# Patient Record
Sex: Female | Born: 1985 | Race: Black or African American | Hispanic: No | Marital: Married | Smoking: Former smoker
Health system: Southern US, Community
[De-identification: ages and names within clinical notes are randomized; demographics above are authoritative.]

## PROBLEM LIST (undated history)

## (undated) DIAGNOSIS — K219 Gastro-esophageal reflux disease without esophagitis: Secondary | ICD-10-CM

## (undated) HISTORY — PX: APPENDECTOMY: SHX54

---

## 2011-01-18 HISTORY — PX: TUBAL LIGATION: SHX77

## 2015-08-16 ENCOUNTER — Emergency Department (HOSPITAL_COMMUNITY)
Admission: EM | Admit: 2015-08-16 | Discharge: 2015-08-17 | Disposition: A | Discharge status: Home / Self Care | Payer: Medicaid Other | Attending: Emergency Medicine | Admitting: Emergency Medicine

## 2015-08-16 ENCOUNTER — Encounter (HOSPITAL_COMMUNITY): Payer: Self-pay

## 2015-08-16 DIAGNOSIS — E860 Dehydration: Secondary | ICD-10-CM | POA: Diagnosis not present

## 2015-08-16 DIAGNOSIS — R55 Syncope and collapse: Secondary | ICD-10-CM | POA: Diagnosis present

## 2015-08-16 LAB — URINALYSIS, ROUTINE W REFLEX MICROSCOPIC
Bilirubin Urine: NEGATIVE
Glucose, UA: NEGATIVE mg/dL
Nitrite: NEGATIVE
Specific Gravity, Urine: 1.02 (ref 1.005–1.030)
pH: 6.5 (ref 5.0–8.0)

## 2015-08-16 LAB — BASIC METABOLIC PANEL
Anion gap: 2 — ABNORMAL LOW (ref 5–15)
BUN: 11 mg/dL (ref 6–20)
CO2: 24 mmol/L (ref 22–32)
Calcium: 8.5 mg/dL — ABNORMAL LOW (ref 8.9–10.3)
Chloride: 109 mmol/L (ref 101–111)
Creatinine, Ser: 0.92 mg/dL (ref 0.44–1.00)
GFR calc Af Amer: >60 mL/min (ref >60)
GFR calc non Af Amer: >60 mL/min (ref >60)
Glucose, Bld: 92 mg/dL (ref 65–99)
Potassium: 3.7 mmol/L (ref 3.5–5.1)
Sodium: 135 mmol/L (ref 135–145)

## 2015-08-16 LAB — CBC
HCT: 35 % — ABNORMAL LOW (ref 36.0–46.0)
Hemoglobin: 11.5 g/dL — ABNORMAL LOW (ref 12.0–15.0)
MCH: 30.3 pg (ref 26.0–34.0)
MCHC: 32.9 g/dL (ref 30.0–36.0)
MCV: 92.3 fL (ref 78.0–100.0)
Platelets: 283 10*3/uL (ref 150–400)
RBC: 3.79 MIL/uL — ABNORMAL LOW (ref 3.87–5.11)
RDW: 14.1 % (ref 11.5–15.5)
WBC: 9 10*3/uL (ref 4.0–10.5)

## 2015-08-16 LAB — DIFFERENTIAL
BASOS ABS: 0 10*3/uL (ref 0.0–0.1)
Basophils Relative: 0 %
EOS PCT: 2 %
Eosinophils Absolute: 0.1 10*3/uL (ref 0.0–0.7)
LYMPHS ABS: 2.3 10*3/uL (ref 0.7–4.0)
LYMPHS PCT: 26 %
Monocytes Absolute: 0.9 10*3/uL (ref 0.1–1.0)
Monocytes Relative: 10 %
NEUTROS PCT: 62 %
Neutro Abs: 5.5 10*3/uL (ref 1.7–7.7)

## 2015-08-16 LAB — CBG MONITORING, ED: Glucose-Capillary: 94 mg/dL (ref 65–99)

## 2015-08-16 LAB — URINE MICROSCOPIC-ADD ON

## 2015-08-16 LAB — POC URINE PREG, ED: PREG TEST UR: NEGATIVE

## 2015-08-16 MED ORDER — SODIUM CHLORIDE 0.9 % IV BOLUS (SEPSIS)
1000.0000 mL | Freq: Once | INTRAVENOUS | Status: AC
  Administered 2015-08-16: 1000 mL via INTRAVENOUS

## 2015-08-16 MED ORDER — ONDANSETRON HCL 4 MG/2ML IJ SOLN
4.0000 mg | Freq: Once | INTRAMUSCULAR | Status: AC
  Administered 2015-08-16: 4 mg via INTRAVENOUS
  Filled 2015-08-16: qty 2

## 2015-08-16 NOTE — ED Notes (Signed)
EKG given to Dr Mcmanus, 

## 2015-08-16 NOTE — ED Triage Notes (Signed)
Patient states she has been driving a lot today, and this evening was fixing dinner and "blacked out" patient states she fell against a chair. States episode was less than one minute. Patient states she felt weak prior to syncopal episode

## 2015-08-17 NOTE — Discharge Instructions (Signed)
Try to drink plenty of water.  Follow-up with your doctor this week for recheck.  Return here for any worsening symptoms

## 2015-08-18 LAB — URINE CULTURE

## 2015-08-19 NOTE — ED Provider Notes (Signed)
[facility]Y DEPT Provider Note   CSN: [id number] Arrival date & time: 08/16/15  3016  First Provider Contact:  First MD Initiated Contact with Patient 08/16/15 2135        History   Chief Complaint Chief Complaint  Patient presents with  . Loss of Consciousness    HPI Kimberly Kline is a 30 y.o. female.  HPI  Kimberly Kline is a 30 y.o. female who presents to the Emergency Department complaining of a single, breif episode of syncope shortly prior to ER arrival.  She states that she drove home from [place] and did not drink as much fluids as usual and only ate "snacks" earlier.  She states that when she arrived home, she "passed out" while cooking dinner.  States that she was "out" for less than one minute.  She denies hx of seizure or previous syncopal episodes.  She denies recent illness, shortness of breath, chest pain, vomiting, headache or visual changes.     History reviewed. No pertinent past medical history.  There are no active problems to display for this patient.   Past Surgical History:  Procedure Laterality Date  . TUBAL LIGATION      OB History    No data available       Home Medications    Prior to Admission medications   Not on File    Family History History reviewed. No pertinent family history.  Social History Social History  Substance Use Topics  . Smoking status: Never Smoker  . Smokeless tobacco: Never Used  . Alcohol use No     Allergies   Review of patient's allergies indicates no known allergies.   Review of Systems Review of Systems  Constitutional: Negative for activity change, appetite change and fever.  HENT: Negative for facial swelling and trouble swallowing.   Eyes: Positive for photophobia. Negative for pain and visual disturbance.  Respiratory: Negative for shortness of breath.   Cardiovascular: Negative for chest pain.  Gastrointestinal: Negative for nausea and vomiting.  Musculoskeletal: Negative for  neck pain and neck stiffness.  Skin: Negative for rash and wound.  Neurological: Positive for syncope. Negative for dizziness, facial asymmetry, speech difficulty, weakness (generalized), numbness and headaches.  Psychiatric/Behavioral: Negative for confusion and decreased concentration.  All other systems reviewed and are negative.    Physical Exam Updated Vital Signs BP 114/83   Pulse (!) 59   Temp 98.6 F (37 C) (Oral)   Resp 19   Ht 5\' 3"  (1.6 m)   Wt 79.4 kg   LMP 07/31/2015   SpO2 100%   BMI 31.00 kg/m   Physical Exam  Constitutional: She is oriented to person, place, and time. She appears well-developed and well-nourished. No distress.  HENT:  Head: Normocephalic and atraumatic.  Mouth/Throat: Oropharynx is clear and moist.  Eyes: EOM are normal. Pupils are equal, round, and reactive to light.  Neck: Normal range of motion and phonation normal. Neck supple. No spinous process tenderness and no muscular tenderness present. No neck rigidity. No Kernig's sign noted.  Cardiovascular: Normal rate, regular rhythm and intact distal pulses.   No murmur heard. Pulmonary/Chest: Effort normal and breath sounds normal. No respiratory distress.  Abdominal: Soft. She exhibits no distension and no mass. There is no tenderness. There is no guarding.  Musculoskeletal: Normal range of motion.  Neurological: She is alert and oriented to person, place, and time. She has normal strength. No cranial nerve deficit or sensory deficit. She exhibits normal muscle  tone. Coordination and gait normal. GCS eye subscore is 4. GCS verbal subscore is 5. GCS motor subscore is 6.  Reflex Scores:      Tricep reflexes are 2+ on the right side and 2+ on the left side.      Bicep reflexes are 2+ on the right side and 2+ on the left side. Skin: Skin is warm and dry.  Psychiatric: She has a normal mood and affect.  Nursing note and vitals reviewed.    ED Treatments / Results  Labs (all labs ordered are  listed, but only abnormal results are displayed) Labs Reviewed  URINE CULTURE - Abnormal; Notable for the following:       Result Value   Culture MULTIPLE SPECIES PRESENT, SUGGEST RECOLLECTION (*)    All other components within normal limits  BASIC METABOLIC PANEL - Abnormal; Notable for the following:    Calcium 8.5 (*)    Anion gap 2 (*)    All other components within normal limits  CBC - Abnormal; Notable for the following:    RBC 3.79 (*)    Hemoglobin 11.5 (*)    HCT 35.0 (*)    All other components within normal limits  URINALYSIS, ROUTINE W REFLEX MICROSCOPIC (NOT AT [facility]) - Abnormal; Notable for the following:    Hgb urine dipstick TRACE (*)    Ketones, ur TRACE (*)    Protein, ur TRACE (*)    Leukocytes, UA TRACE (*)    All other components within normal limits  URINE MICROSCOPIC-ADD ON - Abnormal; Notable for the following:    Squamous Epithelial / LPF 0-5 (*)    Bacteria, UA FEW (*)    All other components within normal limits  DIFFERENTIAL  CBG MONITORING, ED  POC URINE PREG, ED    EKG  EKG Interpretation  Date/Time:  Sunday August 16 2015 20:01:21 EDT Ventricular Rate:  89 PR Interval:  174 QRS Duration: 74 QT Interval:  356 QTC Calculation: 433 R Axis:   72 Text Interpretation:  Normal sinus rhythm Possible Left atrial enlargement Borderline ECG No old tracing to compare Confirmed by BELFI  MD, MELANIE ([id number]) on 08/17/2015 8:59:27 AM       Radiology No results found.  Procedures Procedures (including critical care time)  Medications Ordered in ED Medications  sodium chloride 0.9 % bolus 1,000 mL (0 mLs Intravenous Stopped 08/16/15 2309)  ondansetron (ZOFRAN) injection 4 mg (4 mg Intravenous Given 08/16/15 2156)     Initial Impression / Assessment and Plan / ED Course  I have reviewed the triage vital signs and the nursing notes.  Pertinent labs & imaging results that were available during my care of the patient were reviewed by me and considered  in my medical decision making (see chart for details).  Clinical Course    Pt is well appearing.  vitals stable.  Sx's resolving prior to ED arrival.  No focal neuro deficits.  Not post-ictal.   brief syncope likely vaso vagal syncope.  She is feeling better after IVF's.  Orthostatics reassuring as well as labs.  She appears stable for d/c and given ER return precautions.    Final Clinical Impressions(s) / ED Diagnoses   Final diagnoses:  Syncope, unspecified syncope type  Dehydration    New Prescriptions There are no discharge medications for this patient.    Pauline Aus, PA-C 08/19/15 1832    Raeford Razor, MD 08/20/15 2121

## 2015-09-23 ENCOUNTER — Encounter: Payer: Self-pay | Admitting: Family Medicine

## 2015-09-23 ENCOUNTER — Ambulatory Visit (INDEPENDENT_AMBULATORY_CARE_PROVIDER_SITE_OTHER): Payer: Medicaid Other | Admitting: Family Medicine

## 2015-09-23 VITALS — BP 126/74 | HR 68 | Resp 18 | Ht 63.25 in | Wt 168.0 lb

## 2015-09-23 DIAGNOSIS — Z7689 Persons encountering health services in other specified circumstances: Secondary | ICD-10-CM

## 2015-09-23 DIAGNOSIS — Z23 Encounter for immunization: Secondary | ICD-10-CM | POA: Diagnosis not present

## 2015-09-23 DIAGNOSIS — Z7189 Other specified counseling: Secondary | ICD-10-CM | POA: Diagnosis not present

## 2015-09-23 NOTE — Patient Instructions (Signed)
Come back for a PAP and physical  Get copies of old records   [facility].  We welcome your participation and your questions. It is important that you understand your medical conditions and needs.  Working together to provide the best medical care is our goal.

## 2015-09-23 NOTE — Progress Notes (Signed)
Chief Complaint  Patient presents with  . 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 She had no gestational diabetes or hypertension.   There are no active problems to display for this patient.   No outpatient encounter prescriptions on file as of 09/23/2015.   No facility-administered encounter medications on file as of 09/23/2015.     History reviewed. No pertinent past medical history.  Past Surgical History:  Procedure Laterality Date  . TUBAL LIGATION  2013    Social History   Social History  . Marital status: Married    Spouse name: N/A  . Number of children: 3  . Years of education: 20   Occupational History  . assembly [employer]   Social History Main Topics  . Smoking status: Current Some Day Smoker    Types: Cigars  . Smokeless tobacco: Never Used     Comment: 2x week   . Alcohol use Yes     Comment: 2x week   . Drug use: No  . Sexual activity: Not Currently    Birth control/ protection: Surgical   Other Topics Concern  . Not on file   Social History Narrative  . No narrative on file    Family History  Problem Relation Age of Onset  . Hypertension Mother   . Diabetes Mother   . ADD / ADHD Son   . Diabetes Maternal Grandmother   . Hypertension Maternal Grandmother   . Stroke Maternal Grandmother   . Diabetes Maternal Grandfather   . Hypertension Maternal Grandfather   . Asthma Son   . Asthma Son     Review of Systems  Constitutional: Negative for chills, fever and weight loss.  HENT: Negative for congestion and hearing loss.   Eyes: Negative for blurred vision and pain.  Respiratory: Negative  for cough and shortness of breath.   Cardiovascular: Negative for chest pain and leg swelling.  Gastrointestinal: Negative for abdominal pain, constipation, diarrhea and heartburn.  Genitourinary: Negative for dysuria and frequency.  Musculoskeletal: Negative for falls, joint pain and myalgias.  Skin:       Insect bites  Neurological: Negative for dizziness, seizures and headaches.  Psychiatric/Behavioral: Negative for depression. The patient is not nervous/anxious and does not have insomnia.     BP 126/74   Pulse 68   Resp 18   Ht 5' 3.25" (1.607 m)   Wt 168 lb (76.2 kg)   LMP 09/19/2015 (Within Days)   SpO2 99%   BMI 29.53 kg/m   Physical Exam  Constitutional: She is oriented to person, place, and time. She appears well-developed and well-nourished.  HENT:  Head: Normocephalic and atraumatic.  Right Ear: External ear normal.  Left Ear: External ear normal.  Mouth/Throat: Oropharynx is clear and moist.  Eyes: Conjunctivae are normal. Pupils are equal, round, and reactive to light.  Neck: Normal range of motion. Neck supple. No thyromegaly present.  Cardiovascular: Normal rate, regular rhythm and normal heart sounds.   Pulmonary/Chest: Effort normal and breath sounds normal. No respiratory distress.  Abdominal: Soft. Bowel sounds are normal.  Musculoskeletal: Normal range of motion. She exhibits no edema.  Lymphadenopathy:    She has no cervical adenopathy.  Neurological: She is alert and oriented to person, place, and time.  Gait normal  Skin: Skin is warm and dry.  Multiple pink papules c/w insect bites face, arms  Psychiatric: She has a normal mood and affect. Her behavior is normal. Thought content normal.  Nursing note and vitals reviewed.   Encounter to establish care with new doctor  Encounter for immunization  - Flu Vaccine QUAD 36+ mos IM   Patient Instructions  Come back for a PAP and physical  Get copies of old records   It was my pleasure to see you  today at [facility].  We welcome your participation and your questions. It is important that you understand your medical conditions and needs.  Working together to provide the best medical care is our goal.     Eustace Moore, MD

## 2015-10-28 ENCOUNTER — Encounter: Payer: Self-pay | Admitting: Family Medicine

## 2015-10-28 ENCOUNTER — Encounter: Payer: Medicaid Other | Admitting: Family Medicine

## 2015-12-12 ENCOUNTER — Encounter (HOSPITAL_COMMUNITY): Payer: Self-pay | Admitting: *Deleted

## 2015-12-12 ENCOUNTER — Emergency Department (HOSPITAL_COMMUNITY)
Admission: EM | Admit: 2015-12-12 | Discharge: 2015-12-12 | Disposition: A | Discharge status: Home / Self Care | Payer: Medicaid Other | Attending: Emergency Medicine | Admitting: Emergency Medicine

## 2015-12-12 DIAGNOSIS — F17290 Nicotine dependence, other tobacco product, uncomplicated: Secondary | ICD-10-CM | POA: Diagnosis not present

## 2015-12-12 DIAGNOSIS — R112 Nausea with vomiting, unspecified: Secondary | ICD-10-CM | POA: Diagnosis not present

## 2015-12-12 DIAGNOSIS — R1013 Epigastric pain: Secondary | ICD-10-CM | POA: Insufficient documentation

## 2015-12-12 LAB — URINALYSIS, ROUTINE W REFLEX MICROSCOPIC
Bilirubin Urine: NEGATIVE
Glucose, UA: NEGATIVE mg/dL
Ketones, ur: NEGATIVE mg/dL
LEUKOCYTES UA: NEGATIVE
Nitrite: NEGATIVE
PROTEIN: NEGATIVE mg/dL
Specific Gravity, Urine: 1.02 (ref 1.005–1.030)
pH: 6 (ref 5.0–8.0)

## 2015-12-12 LAB — COMPREHENSIVE METABOLIC PANEL
ALT: 15 U/L (ref 14–54)
AST: 16 U/L (ref 15–41)
Albumin: 3.2 g/dL — ABNORMAL LOW (ref 3.5–5.0)
Alkaline Phosphatase: 37 U/L — ABNORMAL LOW (ref 38–126)
Anion gap: 4 — ABNORMAL LOW (ref 5–15)
BUN: 14 mg/dL (ref 6–20)
CHLORIDE: 110 mmol/L (ref 101–111)
CO2: 23 mmol/L (ref 22–32)
Calcium: 8.3 mg/dL — ABNORMAL LOW (ref 8.9–10.3)
Creatinine, Ser: 0.88 mg/dL (ref 0.44–1.00)
Glucose, Bld: 93 mg/dL (ref 65–99)
POTASSIUM: 4 mmol/L (ref 3.5–5.1)
SODIUM: 137 mmol/L (ref 135–145)
Total Bilirubin: 0.1 mg/dL — ABNORMAL LOW (ref 0.3–1.2)
Total Protein: 7.1 g/dL (ref 6.5–8.1)

## 2015-12-12 LAB — PREGNANCY, URINE: PREG TEST UR: NEGATIVE

## 2015-12-12 LAB — CBC WITH DIFFERENTIAL/PLATELET
BASOS ABS: 0 10*3/uL (ref 0.0–0.1)
BASOS PCT: 0 %
EOS ABS: 0.2 10*3/uL (ref 0.0–0.7)
EOS PCT: 2 %
HCT: 34.3 % — ABNORMAL LOW (ref 36.0–46.0)
Hemoglobin: 11.1 g/dL — ABNORMAL LOW (ref 12.0–15.0)
LYMPHS PCT: 35 %
Lymphs Abs: 2.4 10*3/uL (ref 0.7–4.0)
MCH: 30.4 pg (ref 26.0–34.0)
MCHC: 32.4 g/dL (ref 30.0–36.0)
MCV: 94 fL (ref 78.0–100.0)
MONO ABS: 0.5 10*3/uL (ref 0.1–1.0)
Monocytes Relative: 8 %
Neutro Abs: 3.7 10*3/uL (ref 1.7–7.7)
Neutrophils Relative %: 55 %
PLATELETS: 287 10*3/uL (ref 150–400)
RBC: 3.65 MIL/uL — ABNORMAL LOW (ref 3.87–5.11)
RDW: 15 % (ref 11.5–15.5)
WBC: 6.8 10*3/uL (ref 4.0–10.5)

## 2015-12-12 LAB — URINE MICROSCOPIC-ADD ON
Bacteria, UA: NONE SEEN
WBC UA: NONE SEEN WBC/hpf (ref 0–5)

## 2015-12-12 LAB — LIPASE, BLOOD: LIPASE: 29 U/L (ref 11–51)

## 2015-12-12 MED ORDER — FAMOTIDINE IN NACL 20-0.9 MG/50ML-% IV SOLN
20.0000 mg | Freq: Once | INTRAVENOUS | Status: AC
  Administered 2015-12-12: 20 mg via INTRAVENOUS
  Filled 2015-12-12: qty 50

## 2015-12-12 MED ORDER — OMEPRAZOLE 20 MG PO CPDR: 20.0000 mg | DELAYED_RELEASE_CAPSULE | Freq: Every day | ORAL | 0 refills | Status: DC

## 2015-12-12 MED ORDER — GI COCKTAIL ~~LOC~~
30.0000 mL | Freq: Once | ORAL | Status: AC
  Administered 2015-12-12: 30 mL via ORAL
  Filled 2015-12-12: qty 30

## 2015-12-12 MED ORDER — ONDANSETRON HCL 4 MG/2ML IJ SOLN
4.0000 mg | Freq: Once | INTRAMUSCULAR | Status: AC
  Administered 2015-12-12: 4 mg via INTRAVENOUS
  Filled 2015-12-12: qty 2

## 2015-12-12 MED ORDER — SODIUM CHLORIDE 0.9 % IV BOLUS (SEPSIS)
1000.0000 mL | Freq: Once | INTRAVENOUS | Status: AC
  Administered 2015-12-12: 1000 mL via INTRAVENOUS

## 2015-12-12 MED ORDER — SUCRALFATE 1 G PO TABS: 1.0000 g | ORAL_TABLET | Freq: Three times a day (TID) | ORAL | 0 refills | Status: DC

## 2015-12-12 NOTE — ED Provider Notes (Signed)
[facility]-EMERGENCY DEPT Provider Note   CSN: [id number] Arrival date & time: 12/12/15  0423     History   Chief Complaint Chief Complaint  Patient presents with  . Abdominal Pain    HPI Kimberly Kline is a 30 y.o. female.  Patient complains of constant epigastric pain for the past 5 days. It is worse with eating. Patient chooses been vomiting once or twice a day and eating only crackers and ginger ale. Denies diarrhea. Denies fever. States history of acid reflux but does not take medication for it. States this is worse on rest reflux pain. She still has a gallbladder. Denies any urinary or vaginal symptoms. Denies any chest pain or shortness of breath. Denies any excessive alcohol, NSAIDs, spicy food use. She came in today because she could not sleep because of the pain. She's been using Maalox without relief.   The history is provided by the patient.  Abdominal Pain   Associated symptoms include nausea and vomiting. Pertinent negatives include fever, diarrhea, dysuria, hematuria, headaches, arthralgias and myalgias.    History reviewed. No pertinent past medical history.  There are no active problems to display for this patient.   Past Surgical History:  Procedure Laterality Date  . TUBAL LIGATION  2013    OB History    No data available       Home Medications    Prior to Admission medications   Not on File    Family History Family History  Problem Relation Age of Onset  . Hypertension Mother   . Diabetes Mother   . ADD / ADHD Son   . Diabetes Maternal Grandmother   . Hypertension Maternal Grandmother   . Stroke Maternal Grandmother   . Diabetes Maternal Grandfather   . Hypertension Maternal Grandfather   . Asthma Son   . Asthma Son     Social History Social History  Substance Use Topics  . Smoking status: Current Some Day Smoker    Types: Cigars  . Smokeless tobacco: Never Used     Comment: 2x week   . Alcohol use Yes     Comment: 2x week       Allergies   Patient has no known allergies.   Review of Systems Review of Systems  Constitutional: Positive for activity change and appetite change. Negative for fatigue and fever.  HENT: Negative for congestion and rhinorrhea.   Respiratory: Negative for cough, chest tightness and shortness of breath.   Cardiovascular: Negative for chest pain.  Gastrointestinal: Positive for abdominal pain, nausea and vomiting. Negative for diarrhea.  Genitourinary: Negative for dysuria, hematuria, vaginal bleeding and vaginal discharge.  Musculoskeletal: Negative for arthralgias and myalgias.  Skin: Negative for wound.  Neurological: Negative for dizziness, weakness and headaches.  A complete 10 system review of systems was obtained and all systems are negative except as noted in the HPI and PMH.     Physical Exam Updated Vital Signs BP 122/83   Pulse 66   Temp 98.1 F (36.7 C) (Oral)   Resp 16   Ht 5\' 3"  (1.6 m)   Wt 175 lb (79.4 kg)   LMP 11/27/2015   SpO2 100%   BMI 31.00 kg/m   Physical Exam  Constitutional: She is oriented to person, place, and time. She appears well-developed and well-nourished. No distress.  obese  HENT:  Head: Normocephalic and atraumatic.  Mouth/Throat: Oropharynx is clear and moist. No oropharyngeal exudate.  Eyes: Conjunctivae and EOM are normal. Pupils are equal, round,  and reactive to light.  Neck: Normal range of motion. Neck supple.  No meningismus.  Cardiovascular: Normal rate, regular rhythm, normal heart sounds and intact distal pulses.   No murmur heard. Pulmonary/Chest: Effort normal and breath sounds normal. No respiratory distress.  Abdominal: Soft. There is tenderness. There is no rebound and no guarding.  Epigastric tenderness. No RUQ tenderness  Musculoskeletal: Normal range of motion. She exhibits no edema or tenderness.  No CVAT  Neurological: She is alert and oriented to person, place, and time. No cranial nerve deficit. She  exhibits normal muscle tone. Coordination normal.  No ataxia on finger to nose bilaterally. No pronator drift. 5/5 strength throughout. CN 2-12 intact.Equal grip strength. Sensation intact.   Skin: Skin is warm.  Psychiatric: She has a normal mood and affect. Her behavior is normal.  Nursing note and vitals reviewed.    ED Treatments / Results  Labs (all labs ordered are listed, but only abnormal results are displayed) Labs Reviewed  CBC WITH DIFFERENTIAL/PLATELET  COMPREHENSIVE METABOLIC PANEL  LIPASE, BLOOD  URINALYSIS, ROUTINE W REFLEX MICROSCOPIC (NOT AT [facility])  PREGNANCY, URINE    EKG  EKG Interpretation None       Radiology No results found.  Procedures Procedures (including critical care time)  Medications Ordered in ED Medications  famotidine (PEPCID) IVPB 20 mg premix (not administered)  gi cocktail (Maalox,Lidocaine,Donnatal) (not administered)  ondansetron (ZOFRAN) injection 4 mg (not administered)  sodium chloride 0.9 % bolus 1,000 mL (not administered)     Initial Impression / Assessment and Plan / ED Course  I have reviewed the triage vital signs and the nursing notes.  Pertinent labs & imaging results that were available during my care of the patient were reviewed by me and considered in my medical decision making (see chart for details).  Clinical Course   Epigastric abdominal pain with ongoing nausea and vomiting. History of acid reflux but does not take medication. No right upper quadrant pain.  Labs reassuring. LFTs and lipase normal. Urinalysis negative.  Patient given GI cocktail and Pepcid. Pain resolved after GI cocktail.  Treat for gastritis/acid reflux. Doubt gallbladder pathology. Patient informed to stop using NSAIDs, caffeine, alcohol, fatty foods and follow-up with PCP and gastroenterology. Return precautions discussed..  Final Clinical Impressions(s) / ED Diagnoses   Final diagnoses:  Epigastric pain    New Prescriptions New  Prescriptions   No medications on file     Glynn OctaveStephen Muhamad Serano, MD 12/12/15 [phone]

## 2015-12-12 NOTE — ED Notes (Signed)
Pt updated on plan of care,  

## 2015-12-12 NOTE — ED Notes (Signed)
Update given, pt drinking ginger ale, denies any pain,

## 2015-12-12 NOTE — Discharge Instructions (Signed)
Take medications as prescribed. Avoid alcohol, caffeine, spicy foods, anti-inflammatory medications such as Advil and naproxen. Follow up with the stomach doctor. Return to the ED if you develop new or worsening symptoms.

## 2015-12-12 NOTE — ED Triage Notes (Signed)
Pt c/o epigastric pain with n/v for the past 5 days,

## 2016-03-07 ENCOUNTER — Emergency Department (HOSPITAL_COMMUNITY)
Admission: EM | Admit: 2016-03-07 | Discharge: 2016-03-07 | Disposition: A | Discharge status: Home / Self Care | Payer: Medicaid Other | Attending: Emergency Medicine | Admitting: Emergency Medicine

## 2016-03-07 ENCOUNTER — Encounter (HOSPITAL_COMMUNITY): Payer: Self-pay | Admitting: Emergency Medicine

## 2016-03-07 DIAGNOSIS — M791 Myalgia: Secondary | ICD-10-CM | POA: Diagnosis present

## 2016-03-07 DIAGNOSIS — F17290 Nicotine dependence, other tobacco product, uncomplicated: Secondary | ICD-10-CM | POA: Insufficient documentation

## 2016-03-07 DIAGNOSIS — J111 Influenza due to unidentified influenza virus with other respiratory manifestations: Secondary | ICD-10-CM | POA: Diagnosis not present

## 2016-03-07 MED ORDER — IBUPROFEN 600 MG PO TABS: 600.0000 mg | ORAL_TABLET | Freq: Four times a day (QID) | ORAL | 0 refills | Status: DC | PRN

## 2016-03-07 MED ORDER — HYDROCODONE-HOMATROPINE 5-1.5 MG/5ML PO SYRP: 5.0000 mL | ORAL_SOLUTION | Freq: Four times a day (QID) | ORAL | 0 refills | Status: DC | PRN

## 2016-03-07 MED ORDER — ONDANSETRON HCL 4 MG PO TABS: 4.0000 mg | ORAL_TABLET | Freq: Four times a day (QID) | ORAL | 0 refills | Status: DC

## 2016-03-07 MED ORDER — IBUPROFEN 800 MG PO TABS
800.0000 mg | ORAL_TABLET | Freq: Once | ORAL | Status: AC
  Administered 2016-03-07: 800 mg via ORAL
  Filled 2016-03-07: qty 1

## 2016-03-07 MED ORDER — OSELTAMIVIR PHOSPHATE 75 MG PO CAPS: 75.0000 mg | ORAL_CAPSULE | Freq: Two times a day (BID) | ORAL | 0 refills | Status: DC

## 2016-03-07 MED ORDER — OSELTAMIVIR PHOSPHATE 75 MG PO CAPS
75.0000 mg | ORAL_CAPSULE | Freq: Once | ORAL | Status: AC
  Administered 2016-03-07: 75 mg via ORAL
  Filled 2016-03-07: qty 1

## 2016-03-07 MED ORDER — ACETAMINOPHEN 500 MG PO TABS
1000.0000 mg | ORAL_TABLET | Freq: Once | ORAL | Status: AC
  Administered 2016-03-07: 1000 mg via ORAL
  Filled 2016-03-07: qty 2

## 2016-03-07 NOTE — ED Triage Notes (Signed)
Patient c/o flu like symptoms that started today after waking up. Per patient body aches, cough, fever, headache, nausea, and sore throat. Denies taking anything for fever. Denies any vomiting or diarrhea.

## 2016-03-07 NOTE — ED Provider Notes (Signed)
[facility]-EMERGENCY DEPT Provider Note   CSN: [id number] Arrival date & time: 03/07/16  1550  By signing my name below, I, Cynda Acres, attest that this documentation has been prepared under the direction and in the presence of [employer].  Electronically Signed: Cynda Acres, Scribe. 03/07/16. 6:21 PM.   History   Chief Complaint Chief Complaint  Patient presents with  . Generalized Body Aches   HPI Comments: Kimberly Kline is a 31 y.o. female with no apparent medical hx, who presents to the Emergency Department complaining of gradual-onset, consant generalized body aches that began earlier today around 2 PM. Patient reports associated fever (103 max), cough, headache, nausea, and sore throat. Patient has had flu exposure recently. Patient did not have a flu shot this year. No modifying factors indicated. Patient denies any emesis or diarrhea.   HPI  History reviewed. No pertinent past medical history.  There are no active problems to display for this patient.   Past Surgical History:  Procedure Laterality Date  . TUBAL LIGATION  2013    OB History    Gravida Para Term Preterm AB Living   3 3 3     3    SAB TAB Ectopic Multiple Live Births                   Home Medications    Prior to Admission medications   Medication Sig Start Date End Date Taking? Authorizing Provider  omeprazole (PRILOSEC) 20 MG capsule Take 1 capsule (20 mg total) by mouth daily. 12/12/15   Glynn Octave, MD  sucralfate (CARAFATE) 1 g tablet Take 1 tablet (1 g total) by mouth 4 (four) times daily -  with meals and at bedtime. 12/12/15   Glynn Octave, MD    Family History Family History  Problem Relation Age of Onset  . Hypertension Mother   . Diabetes Mother   . ADD / ADHD Son   . Diabetes Maternal Grandmother   . Hypertension Maternal Grandmother   . Stroke Maternal Grandmother   . Diabetes Maternal Grandfather   . Hypertension Maternal Grandfather   . Asthma Son   .  Asthma Son     Social History Social History  Substance Use Topics  . Smoking status: Current Some Day Smoker    Types: Cigars  . Smokeless tobacco: Never Used     Comment: 2x week   . Alcohol use Yes     Comment: 2x week      Allergies   Patient has no known allergies.   Review of Systems Review of Systems  Constitutional: Positive for fever.  HENT: Positive for sore throat.   Respiratory: Positive for cough.   Gastrointestinal: Positive for nausea. Negative for diarrhea and vomiting.  Musculoskeletal: Positive for myalgias.  Neurological: Positive for headaches.  All other systems reviewed and are negative.    Physical Exam Updated Vital Signs BP 143/70 (BP Location: Left Arm)   Pulse 98   Temp (!) 103.1 F (39.5 C) (Oral)   Resp 18   Ht 5\' 3"  (1.6 m)   Wt 170 lb (77.1 kg)   LMP 03/07/2016   SpO2 100%   BMI 30.11 kg/m   Physical Exam  Constitutional: She is oriented to person, place, and time. She appears well-developed.  HENT:  Head: Normocephalic and atraumatic.  Mouth/Throat: Oropharynx is clear and moist.  Mild to moderate nasal congestion present. Oropharynx clear. Uvula midline.   Eyes: Conjunctivae and EOM are normal. Pupils are  equal, round, and reactive to light.  Neck: Normal range of motion. Neck supple.  Full range of motion, no cervical lymph adenopathy.   Cardiovascular: Normal rate.  Exam reveals no gallop and no friction rub.   Tachycardia present.   Pulmonary/Chest: Effort normal and breath sounds normal. She has no wheezes.  Symmetrical rise and fall of the chest. Lungs are clear.   Abdominal: Soft. Bowel sounds are normal. She exhibits no distension. There is no tenderness ( ).  No splenomegaly, no organomegaly.   Musculoskeletal: Normal range of motion.  Neurological: She is alert and oriented to person, place, and time.  Skin: Skin is warm and dry.  No rash of the palms.   Psychiatric: She has a normal mood and affect.     ED  Treatments / Results  DIAGNOSTIC STUDIES: Oxygen Saturation is 100% on RA, normal by my interpretation.    COORDINATION OF CARE: 6:20 PM Discussed treatment plan with pt at bedside and pt agreed to plan, which includes tamiflu, ibuprofen, tylenol, increased fluids, and good handwashing.   Labs (all labs ordered are listed, but only abnormal results are displayed) Labs Reviewed - No data to display  EKG  EKG Interpretation None       Radiology No results found.  Procedures Procedures (including critical care time)  Medications Ordered in ED Medications - No data to display   Initial Impression / Assessment and Plan / ED Course  I have reviewed the triage vital signs and the nursing notes.  Pertinent labs & imaging results that were available during my care of the patient were reviewed by me and considered in my medical decision making (see chart for details).     **I have reviewed nursing notes, vital signs, and all appropriate lab and imaging results for this patient.*  Final Clinical Impressions(s) / ED Diagnoses   MDM: Patient with influenza type symptoms that started today. Patient has been exposed to someone who has been diagnosed with influenza. She will be treated with hycodan, Zofran, tamiflu, and ibuprofen. Discussed good hand washing and increased fluids. Patient is to return to the PCP or return to the ED if her symptoms worsen.   Final diagnoses:  Influenza    New Prescriptions New Prescriptions   No medications on file   **I personally performed the services described in this documentation, which was scribed in my presence. The recorded information has been reviewed and is accurate.Ivery Quale*    Favian Kittleson, PA-C 03/07/16 1834    Lavera Guiseana Duo Liu, MD 03/08/16 [phone]

## 2016-03-07 NOTE — Discharge Instructions (Signed)
Your examination suggest influenza. Please use your mask until symptoms have resolved. Please wash hands frequently. Increase water, juices, Gatorade, Kool-Aid, etc. Use Tamiflu and ibuprofen daily. Use Hycodan for cough if needed. Use Zofran for nausea if needed. Please see Dr. Delton SeeNelson, or return to the emergency department if any changes, problems, or concerns.

## 2016-03-19 ENCOUNTER — Encounter (HOSPITAL_COMMUNITY): Payer: Self-pay | Admitting: *Deleted

## 2016-03-19 ENCOUNTER — Emergency Department (HOSPITAL_COMMUNITY)
Admission: EM | Admit: 2016-03-19 | Discharge: 2016-03-19 | Disposition: A | Discharge status: Home / Self Care | Payer: Medicaid Other | Attending: Emergency Medicine | Admitting: Emergency Medicine

## 2016-03-19 DIAGNOSIS — F17290 Nicotine dependence, other tobacco product, uncomplicated: Secondary | ICD-10-CM | POA: Insufficient documentation

## 2016-03-19 DIAGNOSIS — K2100 Gastro-esophageal reflux disease with esophagitis, without bleeding: Secondary | ICD-10-CM

## 2016-03-19 DIAGNOSIS — R1013 Epigastric pain: Secondary | ICD-10-CM | POA: Diagnosis present

## 2016-03-19 LAB — COMPREHENSIVE METABOLIC PANEL
ALK PHOS: 33 U/L — AB (ref 38–126)
ALT: 15 U/L (ref 14–54)
ANION GAP: 4 — AB (ref 5–15)
AST: 18 U/L (ref 15–41)
Albumin: 3.4 g/dL — ABNORMAL LOW (ref 3.5–5.0)
BILIRUBIN TOTAL: 0.4 mg/dL (ref 0.3–1.2)
BUN: 9 mg/dL (ref 6–20)
CALCIUM: 8.7 mg/dL — AB (ref 8.9–10.3)
CO2: 30 mmol/L (ref 22–32)
CREATININE: 0.84 mg/dL (ref 0.44–1.00)
Chloride: 104 mmol/L (ref 101–111)
GFR calc Af Amer: >60 mL/min (ref >60)
GFR calc non Af Amer: >60 mL/min (ref >60)
GLUCOSE: 93 mg/dL (ref 65–99)
Potassium: 4.2 mmol/L (ref 3.5–5.1)
SODIUM: 138 mmol/L (ref 135–145)
TOTAL PROTEIN: 7.5 g/dL (ref 6.5–8.1)

## 2016-03-19 LAB — URINALYSIS, ROUTINE W REFLEX MICROSCOPIC
BILIRUBIN URINE: NEGATIVE
Bacteria, UA: NONE SEEN
GLUCOSE, UA: NEGATIVE mg/dL
HGB URINE DIPSTICK: NEGATIVE
Ketones, ur: NEGATIVE mg/dL
Leukocytes, UA: NEGATIVE
Nitrite: NEGATIVE
PH: 7 (ref 5.0–8.0)
Protein, ur: NEGATIVE mg/dL
SPECIFIC GRAVITY, URINE: 1.018 (ref 1.005–1.030)

## 2016-03-19 LAB — CBC
HCT: 36.2 % (ref 36.0–46.0)
Hemoglobin: 11.6 g/dL — ABNORMAL LOW (ref 12.0–15.0)
MCH: 30.2 pg (ref 26.0–34.0)
MCHC: 32 g/dL (ref 30.0–36.0)
MCV: 94.3 fL (ref 78.0–100.0)
PLATELETS: 316 10*3/uL (ref 150–400)
RBC: 3.84 MIL/uL — ABNORMAL LOW (ref 3.87–5.11)
RDW: 15.8 % — AB (ref 11.5–15.5)
WBC: 8.5 10*3/uL (ref 4.0–10.5)

## 2016-03-19 LAB — I-STAT BETA HCG BLOOD, ED (MC, WL, AP ONLY)

## 2016-03-19 LAB — LIPASE, BLOOD: Lipase: 16 U/L (ref 11–51)

## 2016-03-19 MED ORDER — GI COCKTAIL ~~LOC~~
30.0000 mL | Freq: Once | ORAL | Status: AC
  Administered 2016-03-19: 30 mL via ORAL
  Filled 2016-03-19: qty 30

## 2016-03-19 MED ORDER — PANTOPRAZOLE SODIUM 20 MG PO TBEC: 20.0000 mg | DELAYED_RELEASE_TABLET | Freq: Every day | ORAL | 0 refills | Status: DC

## 2016-03-19 MED ORDER — TRAMADOL HCL 50 MG PO TABS: 50.0000 mg | ORAL_TABLET | Freq: Four times a day (QID) | ORAL | 0 refills | Status: DC | PRN

## 2016-03-19 NOTE — ED Provider Notes (Signed)
[facility]-EMERGENCY DEPT Provider Note   CSN: [id number] Arrival date & time: 03/19/16  1032     History   Chief Complaint Chief Complaint  Patient presents with  . Abdominal Pain    HPI Kimberly Kline is a 31 y.o. female.  Patient complains of epigastric pain. She's been taken Motrin for a while for flu. She also has low back pain    The history is provided by the patient. No language interpreter was used.  Abdominal Pain   This is a new problem. The current episode started more than 2 days ago. The problem occurs constantly. The problem has not changed since onset.The pain is associated with an unknown factor. The pain is located in the epigastric region. Pertinent negatives include diarrhea, frequency, hematuria and headaches.    No past medical history on file.  There are no active problems to display for this patient.   Past Surgical History:  Procedure Laterality Date  . TUBAL LIGATION  2013    OB History    Gravida Para Term Preterm AB Living   3 3 3     3    SAB TAB Ectopic Multiple Live Births                   Home Medications    Prior to Admission medications   Medication Sig Start Date End Date Taking? Authorizing Provider  HYDROcodone-homatropine (HYCODAN) 5-1.5 MG/5ML syrup Take 5 mLs by mouth every 6 (six) hours as needed. 03/07/16  Yes Ivery QualeHobson Bryant, PA-C  ibuprofen (ADVIL,MOTRIN) 600 MG tablet Take 1 tablet (600 mg total) by mouth every 6 (six) hours as needed. 03/07/16  Yes Ivery QualeHobson Bryant, PA-C  ondansetron (ZOFRAN) 4 MG tablet Take 1 tablet (4 mg total) by mouth every 6 (six) hours. 03/07/16  Yes Ivery QualeHobson Bryant, PA-C  sucralfate (CARAFATE) 1 g tablet Take 1 tablet (1 g total) by mouth 4 (four) times daily -  with meals and at bedtime. 12/12/15  Yes Glynn OctaveStephen Rancour, MD  omeprazole (PRILOSEC) 20 MG capsule Take 1 capsule (20 mg total) by mouth daily. Patient not taking: Reported on 03/19/2016 12/12/15   Glynn OctaveStephen Rancour, MD  oseltamivir (TAMIFLU) 75 MG  capsule Take 1 capsule (75 mg total) by mouth every 12 (twelve) hours. Patient not taking: Reported on 03/19/2016 03/07/16   Ivery QualeHobson Bryant, PA-C  pantoprazole (PROTONIX) 20 MG tablet Take 1 tablet (20 mg total) by mouth daily. 03/19/16   Bethann BerkshireJoseph Kavontae Pritchard, MD  traMADol (ULTRAM) 50 MG tablet Take 1 tablet (50 mg total) by mouth every 6 (six) hours as needed. 03/19/16   Bethann BerkshireJoseph Dawnn Nam, MD    Family History Family History  Problem Relation Age of Onset  . Hypertension Mother   . Diabetes Mother   . ADD / ADHD Son   . Diabetes Maternal Grandmother   . Hypertension Maternal Grandmother   . Stroke Maternal Grandmother   . Diabetes Maternal Grandfather   . Hypertension Maternal Grandfather   . Asthma Son   . Asthma Son     Social History Social History  Substance Use Topics  . Smoking status: Current Some Day Smoker    Types: Cigars  . Smokeless tobacco: Never Used     Comment: 2x week   . Alcohol use Yes     Comment: 2x week      Allergies   Patient has no known allergies.   Review of Systems Review of Systems  Constitutional: Negative for appetite change and fatigue.  HENT:  Negative for congestion, ear discharge and sinus pressure.   Eyes: Negative for discharge.  Respiratory: Negative for cough.   Cardiovascular: Negative for chest pain.  Gastrointestinal: Positive for abdominal pain. Negative for diarrhea.  Genitourinary: Negative for frequency and hematuria.  Musculoskeletal: Positive for back pain.  Skin: Negative for rash.  Neurological: Negative for seizures and headaches.  Psychiatric/Behavioral: Negative for hallucinations.     Physical Exam Updated Vital Signs BP 118/74 (BP Location: Right Arm)   Pulse 72   Temp 99 F (37.2 C) (Oral)   Resp 18   Ht 5\' 3"  (1.6 m)   Wt 170 lb (77.1 kg)   LMP 03/07/2016   SpO2 100%   BMI 30.11 kg/m   Physical Exam  Constitutional: She is oriented to person, place, and time. She appears well-developed.  HENT:  Head:  Normocephalic.  Eyes: Conjunctivae and EOM are normal. No scleral icterus.  Neck: Neck supple. No thyromegaly present.  Cardiovascular: Normal rate and regular rhythm.  Exam reveals no gallop and no friction rub.   No murmur heard. Pulmonary/Chest: No stridor. She has no wheezes. She has no rales. She exhibits no tenderness.  Abdominal: She exhibits no distension. There is tenderness. There is no rebound.  Moderate epigastric tenderness  Musculoskeletal: Normal range of motion. She exhibits no edema.  Mild lumbar spine tenderness  Lymphadenopathy:    She has no cervical adenopathy.  Neurological: She is oriented to person, place, and time. She exhibits normal muscle tone. Coordination normal.  Skin: No rash noted. No erythema.  Psychiatric: She has a normal mood and affect. Her behavior is normal.     ED Treatments / Results  Labs (all labs ordered are listed, but only abnormal results are displayed) Labs Reviewed  COMPREHENSIVE METABOLIC PANEL - Abnormal; Notable for the following:       Result Value   Calcium 8.7 (*)    Albumin 3.4 (*)    Alkaline Phosphatase 33 (*)    Anion gap 4 (*)    All other components within normal limits  CBC - Abnormal; Notable for the following:    RBC 3.84 (*)    Hemoglobin 11.6 (*)    RDW 15.8 (*)    All other components within normal limits  LIPASE, BLOOD  URINALYSIS, ROUTINE W REFLEX MICROSCOPIC  I-STAT BETA HCG BLOOD, ED ([facility], [facility], [facility] ONLY)    EKG  EKG Interpretation None       Radiology No results found.  Procedures Procedures (including critical care time)  Medications Ordered in ED Medications  gi cocktail (Maalox,Lidocaine,Donnatal) (30 mLs Oral Given 03/19/16 1224)     Initial Impression / Assessment and Plan / ED Course  I have reviewed the triage vital signs and the nursing notes.  Pertinent labs & imaging results that were available during my care of the patient were reviewed by me and considered in my medical decision  making (see chart for details).     Labs unremarkable. Patient improved with GI cocktail. She'll be put on protonic is also given Ultram for pain and she was told not to take anymore Motrin. She will follow-up with her PCP  Final Clinical Impressions(s) / ED Diagnoses   Final diagnoses:  Gastroesophageal reflux disease with esophagitis    New Prescriptions New Prescriptions   PANTOPRAZOLE (PROTONIX) 20 MG TABLET    Take 1 tablet (20 mg total) by mouth daily.   TRAMADOL (ULTRAM) 50 MG TABLET    Take 1 tablet (50 mg total)  by mouth every 6 (six) hours as needed.     Bethann Berkshire, MD 03/19/16 1409

## 2016-03-19 NOTE — Discharge Instructions (Signed)
Follow-up with your doctor next week for recheck. Stop taking the Motrin.

## 2016-03-19 NOTE — ED Triage Notes (Signed)
Pt comes in with abdominal pain starting 4 days ago. She began having back 2 days ago. Denies any n/v/d.   Pt was here on 2/19 and told she had the flu. Pt took tamiflu as prescribed. NAD note.

## 2016-08-08 ENCOUNTER — Telehealth: Payer: Self-pay | Admitting: Family Medicine

## 2016-08-08 NOTE — Telephone Encounter (Signed)
New Message  Pt voiced also on nurse line she wanted a PET scan  Please f/u

## 2016-08-08 NOTE — Telephone Encounter (Signed)
Called pt to see what she wanted PET scan for, she states she only wants a pap, and a UPT.  Scheduled.

## 2016-08-08 NOTE — Telephone Encounter (Signed)
Patient left message on nurse line, requesting appt for Pap Smear

## 2016-08-12 ENCOUNTER — Encounter: Payer: Medicaid Other | Admitting: Family Medicine

## 2016-08-12 ENCOUNTER — Ambulatory Visit: Payer: Medicaid Other | Admitting: Family Medicine

## 2016-08-18 ENCOUNTER — Encounter: Payer: Medicaid Other | Admitting: Family Medicine

## 2016-08-18 ENCOUNTER — Encounter: Payer: Self-pay | Admitting: Family Medicine

## 2016-09-02 ENCOUNTER — Encounter: Payer: Medicaid Other | Admitting: Family Medicine

## 2017-02-14 ENCOUNTER — Emergency Department (HOSPITAL_COMMUNITY)
Admission: EM | Admit: 2017-02-14 | Discharge: 2017-02-15 | Disposition: A | Discharge status: Home / Self Care | Payer: Self-pay | Attending: Emergency Medicine | Admitting: Emergency Medicine

## 2017-02-14 ENCOUNTER — Other Ambulatory Visit: Payer: Self-pay

## 2017-02-14 ENCOUNTER — Encounter (HOSPITAL_COMMUNITY): Payer: Self-pay | Admitting: Emergency Medicine

## 2017-02-14 ENCOUNTER — Emergency Department (HOSPITAL_COMMUNITY): Payer: Self-pay

## 2017-02-14 DIAGNOSIS — K5900 Constipation, unspecified: Secondary | ICD-10-CM | POA: Insufficient documentation

## 2017-02-14 DIAGNOSIS — R1013 Epigastric pain: Secondary | ICD-10-CM | POA: Insufficient documentation

## 2017-02-14 DIAGNOSIS — Z87891 Personal history of nicotine dependence: Secondary | ICD-10-CM | POA: Insufficient documentation

## 2017-02-14 DIAGNOSIS — R251 Tremor, unspecified: Secondary | ICD-10-CM | POA: Insufficient documentation

## 2017-02-14 DIAGNOSIS — R0602 Shortness of breath: Secondary | ICD-10-CM | POA: Insufficient documentation

## 2017-02-14 DIAGNOSIS — R112 Nausea with vomiting, unspecified: Secondary | ICD-10-CM | POA: Insufficient documentation

## 2017-02-14 HISTORY — DX: Gastro-esophageal reflux disease without esophagitis: K21.9

## 2017-02-14 LAB — CBC
HEMATOCRIT: 37.7 % (ref 36.0–46.0)
Hemoglobin: 11.9 g/dL — ABNORMAL LOW (ref 12.0–15.0)
MCH: 30.7 pg (ref 26.0–34.0)
MCHC: 31.6 g/dL (ref 30.0–36.0)
MCV: 97.2 fL (ref 78.0–100.0)
PLATELETS: 265 10*3/uL (ref 150–400)
RBC: 3.88 MIL/uL (ref 3.87–5.11)
RDW: 13.2 % (ref 11.5–15.5)
WBC: 6.4 10*3/uL (ref 4.0–10.5)

## 2017-02-14 LAB — COMPREHENSIVE METABOLIC PANEL
ALT: 18 U/L (ref 14–54)
AST: 21 U/L (ref 15–41)
Albumin: 3.1 g/dL — ABNORMAL LOW (ref 3.5–5.0)
Alkaline Phosphatase: 35 U/L — ABNORMAL LOW (ref 38–126)
Anion gap: 9 (ref 5–15)
BUN: 13 mg/dL (ref 6–20)
CHLORIDE: 102 mmol/L (ref 101–111)
CO2: 28 mmol/L (ref 22–32)
CREATININE: 0.99 mg/dL (ref 0.44–1.00)
Calcium: 8.8 mg/dL — ABNORMAL LOW (ref 8.9–10.3)
GFR calc Af Amer: >60 mL/min (ref >60)
GFR calc non Af Amer: >60 mL/min (ref >60)
Glucose, Bld: 100 mg/dL — ABNORMAL HIGH (ref 65–99)
POTASSIUM: 3.8 mmol/L (ref 3.5–5.1)
SODIUM: 139 mmol/L (ref 135–145)
Total Bilirubin: 0.4 mg/dL (ref 0.3–1.2)
Total Protein: 6.3 g/dL — ABNORMAL LOW (ref 6.5–8.1)

## 2017-02-14 LAB — POC URINE PREG, ED: Preg Test, Ur: NEGATIVE

## 2017-02-14 LAB — LIPASE, BLOOD: LIPASE: 34 U/L (ref 11–51)

## 2017-02-14 MED ORDER — ONDANSETRON HCL 4 MG/2ML IJ SOLN
4.0000 mg | Freq: Once | INTRAMUSCULAR | Status: AC
  Administered 2017-02-14: 4 mg via INTRAVENOUS
  Filled 2017-02-14: qty 2

## 2017-02-14 MED ORDER — FENTANYL CITRATE (PF) 100 MCG/2ML IJ SOLN
50.0000 ug | Freq: Once | INTRAMUSCULAR | Status: AC
  Administered 2017-02-14: 50 ug via INTRAVENOUS
  Filled 2017-02-14: qty 2

## 2017-02-14 NOTE — ED Triage Notes (Signed)
Patient reports abdominal pain that radiates into her L flank, onset 1 month ago. Patient states she has had episodes of emesis but none today. Patient denies urinary symptoms.

## 2017-02-15 LAB — URINALYSIS, ROUTINE W REFLEX MICROSCOPIC
BILIRUBIN URINE: NEGATIVE
Glucose, UA: NEGATIVE mg/dL
Hgb urine dipstick: NEGATIVE
Ketones, ur: NEGATIVE mg/dL
LEUKOCYTES UA: NEGATIVE
NITRITE: NEGATIVE
PH: 7 (ref 5.0–8.0)
Protein, ur: NEGATIVE mg/dL
Specific Gravity, Urine: 1.016 (ref 1.005–1.030)

## 2017-02-15 MED ORDER — OMEPRAZOLE 20 MG PO CPDR: DELAYED_RELEASE_CAPSULE | ORAL | 0 refills | Status: DC

## 2017-02-15 NOTE — Discharge Instructions (Signed)
Get miralax and put one dose or 17 g in 8 ounces of water,  take 1 dose every 30 minutes for 2-3 hours or until you  get good results and then once or twice daily to prevent constipation. Take the omeprazole as prescribed. Look at the diet recommendations for GERD. Call Dr Darrick PennaFields' office, the gastroenterologist on call, to have her evalutae your abdominal pain. You could have a stomach inflammation or ulcer that would not be visible on the CT scan.   Return to the ED if you get a fever, have uncontrolled vomiting or seem worse.

## 2017-02-15 NOTE — ED Provider Notes (Signed)
[facility] EMERGENCY DEPARTMENT Provider Note   CSN: [id number] Arrival date & time: 02/14/17  2210  Time seen 23:28 PM   History   Chief Complaint Chief Complaint  Patient presents with  . Abdominal Pain    HPI Kimberly Kline is a 32 y.o. female.  HPI patient states for the past 3 weeks she has been having sharp pain in her epigastric area and her left upper quadrant.  She states the pain is sharp when it is at its most intense and then at its baseline is and it is a pain described as soreness.  She states the pain waxes and wanes.  She states when it first started out her abdomen felt tight and it hurts so bad it made her feel short of breath and made her shake all over and she had to leave work.  She states she also has reflux or GERD and describes that as a epigastric pain that is dull in a pushing pain.  She has been taking Zantac and cimetidine and Tums without relief of the current pain.  She has had nausea and has only had about 4 episodes of vomiting in the past 3 weeks.  She denies diarrhea or fever.  She states she now is having left-sided flank pain but denies any pain of her lower abdomen.  Nothing she does makes the pain worse, nothing she does makes it feel better.  She denies dysuria, frequency, hematuria, or rectal bleeding.  She states her abdomen does feel bloated.  She states she has had a normal appetite and does not relate this pain to food.  She states about a week after this pain started she took something called the cleaner and that seemed to make it worse so she stopped taking it.  PCP none  Past Medical History:  Diagnosis Date  . GERD (gastroesophageal reflux disease)     There are no active problems to display for this patient.   Past Surgical History:  Procedure Laterality Date  . TUBAL LIGATION  2013    OB History    Gravida Para Term Preterm AB Living   3 3 3     3    SAB TAB Ectopic Multiple Live Births                   Home Medications     Prior to Admission medications   Medication Sig Start Date End Date Taking? Authorizing Provider  RaNITidine HCl (ACID REDUCER PO) Take 1 tablet by mouth daily as needed (for acid reflux).   Yes [provider]  omeprazole (PRILOSEC) 20 MG capsule Take 1 po BID x 2 weeks then once a day 02/15/17   Devoria Albe, MD    Family History Family History  Problem Relation Age of Onset  . Hypertension Mother   . Diabetes Mother   . ADD / ADHD Son   . Diabetes Maternal Grandmother   . Hypertension Maternal Grandmother   . Stroke Maternal Grandmother   . Diabetes Maternal Grandfather   . Hypertension Maternal Grandfather   . Asthma Son   . Asthma Son     Social History Social History   Tobacco Use  . Smoking status: Former Smoker    Types: Cigars  . Smokeless tobacco: Never Used  . Tobacco comment: 2x week   Substance Use Topics  . Alcohol use: No    Frequency: Never    Comment: 2x week   . Drug use: No  employed   Allergies   Patient has no known allergies.   Review of Systems Review of Systems  All other systems reviewed and are negative.    Physical Exam Updated Vital Signs BP 119/86   Pulse 95   Temp 98.6 F (37 C) (Oral)   Resp 18   Ht 5\' 3"  (1.6 m)   Wt 77.1 kg (170 lb)   LMP 02/11/2017   SpO2 98%   BMI 30.11 kg/m   Vital signs normal    Physical Exam  Constitutional: She is oriented to person, place, and time. She appears well-developed and well-nourished.  Non-toxic appearance. She does not appear ill. No distress.  HENT:  Head: Normocephalic and atraumatic.  Right Ear: External ear normal.  Left Ear: External ear normal.  Nose: Nose normal. No mucosal edema or rhinorrhea.  Mouth/Throat: Oropharynx is clear and moist and mucous membranes are normal. No dental abscesses or uvula swelling.  Eyes: Conjunctivae and EOM are normal. Pupils are equal, round, and reactive to light.  Neck: Normal range of motion and full passive range of motion  without pain. Neck supple.  Cardiovascular: Normal rate, regular rhythm and normal heart sounds. Exam reveals no gallop and no friction rub.  No murmur heard. Pulmonary/Chest: Effort normal and breath sounds normal. No respiratory distress. She has no wheezes. She has no rhonchi. She has no rales. She exhibits no tenderness and no crepitus.  Abdominal: Soft. Normal appearance and bowel sounds are normal. She exhibits no distension. There is generalized tenderness and tenderness in the left upper quadrant. There is no rebound and no guarding.    Patient states the worst pain is in the epigastric area  Patient has left CVA tenderness to percussion.  Musculoskeletal: Normal range of motion. She exhibits no edema or tenderness.  Moves all extremities well.   Neurological: She is alert and oriented to person, place, and time. She has normal strength. No cranial nerve deficit.  Skin: Skin is warm, dry and intact. No rash noted. No erythema. No pallor.  Psychiatric: She has a normal mood and affect. Her speech is normal and behavior is normal. Her mood appears not anxious.  Nursing note and vitals reviewed.    ED Treatments / Results  Labs (all labs ordered are listed, but only abnormal results are displayed) Results for orders placed or performed during the hospital encounter of 02/14/17  Lipase, blood  Result Value Ref Range   Lipase 34 11 - 51 U/L  Comprehensive metabolic panel  Result Value Ref Range   Sodium 139 135 - 145 mmol/L   Potassium 3.8 3.5 - 5.1 mmol/L   Chloride 102 101 - 111 mmol/L   CO2 28 22 - 32 mmol/L   Glucose, Bld 100 (H) 65 - 99 mg/dL   BUN 13 6 - 20 mg/dL   Creatinine, Ser 1.61 0.44 - 1.00 mg/dL   Calcium 8.8 (L) 8.9 - 10.3 mg/dL   Total Protein 6.3 (L) 6.5 - 8.1 g/dL   Albumin 3.1 (L) 3.5 - 5.0 g/dL   AST 21 15 - 41 U/L   ALT 18 14 - 54 U/L   Alkaline Phosphatase 35 (L) 38 - 126 U/L   Total Bilirubin 0.4 0.3 - 1.2 mg/dL   GFR calc non Af Amer >60 >60 mL/min    GFR calc Af Amer >60 >60 mL/min   Anion gap 9 5 - 15  CBC  Result Value Ref Range   WBC 6.4 4.0 - 10.5 K/uL  RBC 3.88 3.87 - 5.11 MIL/uL   Hemoglobin 11.9 (L) 12.0 - 15.0 g/dL   HCT 16.1 09.6 - 04.5 %   MCV 97.2 78.0 - 100.0 fL   MCH 30.7 26.0 - 34.0 pg   MCHC 31.6 30.0 - 36.0 g/dL   RDW 40.9 81.1 - 91.4 %   Platelets 265 150 - 400 K/uL  Urinalysis, Routine w reflex microscopic  Result Value Ref Range   Color, Urine YELLOW YELLOW   APPearance CLEAR CLEAR   Specific Gravity, Urine 1.016 1.005 - 1.030   pH 7.0 5.0 - 8.0   Glucose, UA NEGATIVE NEGATIVE mg/dL   Hgb urine dipstick NEGATIVE NEGATIVE   Bilirubin Urine NEGATIVE NEGATIVE   Ketones, ur NEGATIVE NEGATIVE mg/dL   Protein, ur NEGATIVE NEGATIVE mg/dL   Nitrite NEGATIVE NEGATIVE   Leukocytes, UA NEGATIVE NEGATIVE  POC urine preg, ED (not at [facility])  Result Value Ref Range   Preg Test, Ur NEGATIVE NEGATIVE   Laboratory interpretation all normal except minimal anemia    EKG  EKG Interpretation None       Radiology Ct Renal Stone Study  Result Date: 02/15/2017 CLINICAL DATA:  Abdominal pain radiating to the left flank for 1 month. Vomiting. EXAM: CT ABDOMEN AND PELVIS WITHOUT CONTRAST TECHNIQUE: Multidetector CT imaging of the abdomen and pelvis was performed following the standard protocol without IV contrast. COMPARISON:  None. FINDINGS: Lower chest: Lung bases are clear. Hepatobiliary: No focal liver abnormality is seen. No gallstones, gallbladder wall thickening, or biliary dilatation. Pancreas: Unremarkable. No pancreatic ductal dilatation or surrounding inflammatory changes. Spleen: Normal in size without focal abnormality. Adrenals/Urinary Tract: Adrenal glands are unremarkable. Kidneys are normal, without renal calculi, focal lesion, or hydronephrosis. Bladder is unremarkable. Stomach/Bowel: Stomach is within normal limits. Appendix appears normal. No evidence of bowel wall thickening, distention, or inflammatory  changes. Vascular/Lymphatic: No significant vascular findings are present. No enlarged abdominal or pelvic lymph nodes. Reproductive: Uterus and bilateral adnexa are unremarkable. Other: No abdominal wall hernia or abnormality. No abdominopelvic ascites. Musculoskeletal: No acute or significant osseous findings. IMPRESSION: 1. No renal or ureteral stone or obstruction. No acute process demonstrated in the abdomen or pelvis on noncontrast imaging. Electronically Signed   By: Burman Nieves M.D.   On: 02/15/2017 00:15    Procedures Procedures (including critical care time)  Medications Ordered in ED Medications  fentaNYL (SUBLIMAZE) injection 50 mcg (50 mcg Intravenous Given 02/14/17 2354)  ondansetron (ZOFRAN) injection 4 mg (4 mg Intravenous Given 02/14/17 2354)     Initial Impression / Assessment and Plan / ED Course  I have reviewed the triage vital signs and the nursing notes.  Pertinent labs & imaging results that were available during my care of the patient were reviewed by me and considered in my medical decision making (see chart for details).     When I look at patient CT scan she is noted to have a lot of stool throughout her colon.  I am going to advise her to take a laxative and refer her to a gastric  Recheck at 1:10 AM we discussed her CT results.  We also discussed that look like she had a lot of stool throughout her colon.  She is advised to take over-the-counter MiraLAX and to continue on her GERD medication.  If she continues to have pain she should be evaluated by gastroenterologist, she was given the name of the gastroenterologist on call.  We discussed that the CT scan could miss a stomach  ulcer or gastritis.  That is why she should follow-up with a gastroenterologist.  Looking at the website for the cleaner shows it has soy fiber, [place], apple pectin, psyllium husk, fibersol-2, senna leaf, aloe vera gel, [place], yellow dock root, dandelion root, closed, black walnut  mark, milk thistle, triphala extract, calcium citrate, FOS probiotic growth, magnesium citrate, blessed thistle, red Clover, peppermint leaf, worm wood, corn silk, chastetree berry, [place], caprylic acid.   Final Clinical Impressions(s) / ED Diagnoses   Final diagnoses:  Epigastric abdominal pain  Constipation, unspecified constipation type    ED Discharge Orders        Ordered    omeprazole (PRILOSEC) 20 MG capsule     02/15/17 0124    otc miralax  Plan discharge  Devoria AlbeIva Johntae Broxterman, MD, Concha PyoFACEP     Hayla Hinger, MD 02/15/17 [phone]

## 2017-06-21 ENCOUNTER — Encounter (HOSPITAL_COMMUNITY): Payer: Self-pay | Admitting: *Deleted

## 2017-06-21 ENCOUNTER — Emergency Department (HOSPITAL_COMMUNITY)
Admission: EM | Admit: 2017-06-21 | Discharge: 2017-06-21 | Disposition: A | Discharge status: Home / Self Care | Payer: Medicaid Other | Attending: Emergency Medicine | Admitting: Emergency Medicine

## 2017-06-21 ENCOUNTER — Other Ambulatory Visit: Payer: Self-pay

## 2017-06-21 DIAGNOSIS — J029 Acute pharyngitis, unspecified: Secondary | ICD-10-CM

## 2017-06-21 LAB — GROUP A STREP BY PCR: GROUP A STREP BY PCR: NOT DETECTED

## 2017-06-21 NOTE — Discharge Instructions (Addendum)
Your symptoms are most likely from a virus that has caused an upper respiratory infection. Your rapid strep test was negative. A viral illness typically peaks on day 2-3 and resolves after one week.    The main treatment approach for a viral upper respiratory infection is to treat the symptoms, support your immune system and prevent spread of illness.   Stay well-hydrated. Rest. You can use over the counter medications to help with symptoms: 600 mg ibuprofen (motrin, aleve, advil) or acetaminophen (tylenol) every 6 hours, around the clock to help with associated fevers, sore throat, headaches, generalized body aches and malaise.  Oxymetazoline (afrin) intranasal spray once daily for no more than 3 days to help with congestion, after 3 days you can switch to another over-the-counter nasal steroid spray such as fluticasone (flonase) Allergy medication (loratadine, cetirizine, etc) and phenylephrine (sudafed) help with nasal congestion, runny nose and postnasal drip.   Dextromethorphan (dayquil, tussin, mucinex) to help with chest congestion, cough and mucus.  Wash your hands often to prevent spread.   A viral upper respiratory infection can also worsen and progress into pneumonia.  Monitor your symptoms. If your symptoms worsen, persist and you develop persistent fevers, chest pain, productive cough you should follow up with your primary care provider.

## 2017-06-21 NOTE — ED Triage Notes (Signed)
Pt c/o sore throat and bilateral ear pain that started yesterday

## 2017-06-21 NOTE — ED Provider Notes (Signed)
[facility] EMERGENCY DEPARTMENT Provider Note   CSN: [id number] Arrival date & time: 06/21/17  1243     History   Chief Complaint Chief Complaint  Patient presents with  . Sore Throat    HPI Kimberly Kline is a 32 y.o. female here for evaluation of "burning" discomfort to throat onset last night.  Associated symptoms include burning in bilateral ears, nasal congestion, rhinorrhea.  She denies fevers, chills, cough, nausea, vomiting, abdominal pain, dysuria, flank pain, changes in BM. No sick contacts. Has tried cough drop without relief. No alleviating factors. No aggravating factors.   HPI  Past Medical History:  Diagnosis Date  . GERD (gastroesophageal reflux disease)     There are no active problems to display for this patient.   Past Surgical History:  Procedure Laterality Date  . TUBAL LIGATION  2013     OB History    Gravida  3   Para  3   Term  3   Preterm      AB      Living  3     SAB      TAB      Ectopic      Multiple      Live Births               Home Medications    Prior to Admission medications   Not on File    Family History Family History  Problem Relation Age of Onset  . Hypertension Mother   . Diabetes Mother   . ADD / ADHD Son   . Diabetes Maternal Grandmother   . Hypertension Maternal Grandmother   . Stroke Maternal Grandmother   . Diabetes Maternal Grandfather   . Hypertension Maternal Grandfather   . Asthma Son   . Asthma Son     Social History Social History   Tobacco Use  . Smoking status: Former Smoker    Types: Cigars  . Smokeless tobacco: Never Used  . Tobacco comment: 2x week   Substance Use Topics  . Alcohol use: No    Frequency: Never    Comment: 2x week   . Drug use: No     Allergies   Patient has no known allergies.   Review of Systems Review of Systems  HENT: Positive for congestion, ear pain, rhinorrhea and sore throat.   All other systems reviewed and are  negative.    Physical Exam Updated Vital Signs BP 116/86 (BP Location: Right Arm)   Pulse 78   Temp 98.7 F (37.1 C) (Oral)   Resp 16   Ht 5\' 3"  (1.6 m)   Wt 81.6 kg (180 lb)   LMP 06/17/2017   SpO2 100%   BMI 31.89 kg/m   Physical Exam  Constitutional: She is oriented to person, place, and time. She appears well-developed and well-nourished. No distress.  NAD.  HENT:  Head: Normocephalic and atraumatic.  Right Ear: External ear normal.  Left Ear: External ear normal.  Nose: Nose normal.  Mouth/Throat: Mucous membranes are normal. Tonsils are 2+ on the right. Tonsils are 2+ on the left.  Mild mucosal edema L > R with clear rhinorrhea noted, septum midline. Mildly erythematous oropharynx and tonsils.  Symmetrically enlarged tonsils without exudates, petechiae. No trismus. MMM. No SL edema or tenderness. Uvula midline.  TMs normal bilaterally. No mastoid tenderness. No sinus tenderness.   Eyes: Conjunctivae and EOM are normal. No scleral icterus.  Neck: Normal range of motion. Neck supple.  No cervical adenopathy, anterior edema or tenderness.   Cardiovascular: Normal rate, regular rhythm and normal heart sounds.  Pulmonary/Chest: Effort normal and breath sounds normal.  Musculoskeletal: Normal range of motion. She exhibits no deformity.  Neurological: She is alert and oriented to person, place, and time.  Skin: Skin is warm and dry. Capillary refill takes less than 2 seconds.  Psychiatric: She has a normal mood and affect. Her behavior is normal. Judgment and thought content normal.  Nursing note and vitals reviewed.    ED Treatments / Results  Labs (all labs ordered are listed, but only abnormal results are displayed) Labs Reviewed  GROUP A STREP BY PCR    EKG None  Radiology No results found.  Procedures Procedures (including critical care time)  Medications Ordered in ED Medications - No data to display   Initial Impression / Assessment and Plan / ED  Course  I have reviewed the triage vital signs and the nursing notes.  Pertinent labs & imaging results that were available during my care of the patient were reviewed by me and considered in my medical decision making (see chart for details).    32 y.o. -year-old female with URI like symptoms onset last night with sore throat. No known sick contacts. On my exam patient is nontoxic appearing, speaking in full sentences, w/o increased WOB. No fever, tachypnea, tachycardia, hypoxia. Lungs are CTAB. I do not think that a CXR is indicated at this time as VS are WNL, there are no signs of consolidation on auscultation and there is no hypoxia. No significant h/o immunocompromise. Doubt bronchitis or pneumonia.  Rapid strep negative. Given reassuring physical exam, will discharge with symptomatic treatment. Strict ED return precautions given. Patient is aware that a viral URI infection may precede pneumonia or worsening illness. Patient is aware of red flag symptoms to monitor for that would warrant return to the ED for further reevaluation.    Final Clinical Impressions(s) / ED Diagnoses   Final diagnoses:  Viral pharyngitis    ED Discharge Orders    None       Liberty Handy, PA-C 06/21/17 1716    Samuel Jester, DO 06/22/17 [phone]

## 2017-11-06 ENCOUNTER — Other Ambulatory Visit: Payer: Self-pay

## 2017-11-06 ENCOUNTER — Encounter (HOSPITAL_COMMUNITY): Payer: Self-pay | Admitting: *Deleted

## 2017-11-06 DIAGNOSIS — Z87891 Personal history of nicotine dependence: Secondary | ICD-10-CM | POA: Insufficient documentation

## 2017-11-06 DIAGNOSIS — R300 Dysuria: Secondary | ICD-10-CM | POA: Insufficient documentation

## 2017-11-06 DIAGNOSIS — K5909 Other constipation: Secondary | ICD-10-CM | POA: Insufficient documentation

## 2017-11-06 LAB — COMPREHENSIVE METABOLIC PANEL
ALBUMIN: 3.7 g/dL (ref 3.5–5.0)
ALT: 15 U/L (ref 0–44)
ANION GAP: 6 (ref 5–15)
AST: 14 U/L — ABNORMAL LOW (ref 15–41)
Alkaline Phosphatase: 40 U/L (ref 38–126)
BUN: 8 mg/dL (ref 6–20)
CO2: 26 mmol/L (ref 22–32)
Calcium: 9 mg/dL (ref 8.9–10.3)
Chloride: 104 mmol/L (ref 98–111)
Creatinine, Ser: 0.76 mg/dL (ref 0.44–1.00)
GFR calc Af Amer: >60 mL/min (ref >60)
GFR calc non Af Amer: >60 mL/min (ref >60)
GLUCOSE: 82 mg/dL (ref 70–99)
POTASSIUM: 3.7 mmol/L (ref 3.5–5.1)
SODIUM: 136 mmol/L (ref 135–145)
TOTAL PROTEIN: 8.2 g/dL — AB (ref 6.5–8.1)
Total Bilirubin: 0.4 mg/dL (ref 0.3–1.2)

## 2017-11-06 LAB — CBC
HCT: 38.2 % (ref 36.0–46.0)
Hemoglobin: 11.8 g/dL — ABNORMAL LOW (ref 12.0–15.0)
MCH: 30.2 pg (ref 26.0–34.0)
MCHC: 30.9 g/dL (ref 30.0–36.0)
MCV: 97.7 fL (ref 80.0–100.0)
NRBC: 0 % (ref 0.0–0.2)
Platelets: 291 10*3/uL (ref 150–400)
RBC: 3.91 MIL/uL (ref 3.87–5.11)
RDW: 13 % (ref 11.5–15.5)
WBC: 10.5 10*3/uL (ref 4.0–10.5)

## 2017-11-06 LAB — LIPASE, BLOOD: Lipase: 29 U/L (ref 11–51)

## 2017-11-06 NOTE — ED Triage Notes (Signed)
No BM FOR 4 DAYS

## 2017-11-06 NOTE — ED Triage Notes (Signed)
Abdominal pain x 4 days 

## 2017-11-07 ENCOUNTER — Emergency Department (HOSPITAL_COMMUNITY): Payer: Medicaid Other

## 2017-11-07 ENCOUNTER — Emergency Department (HOSPITAL_COMMUNITY)
Admission: EM | Admit: 2017-11-07 | Discharge: 2017-11-07 | Disposition: A | Discharge status: Home / Self Care | Payer: Medicaid Other | Attending: Emergency Medicine | Admitting: Emergency Medicine

## 2017-11-07 DIAGNOSIS — R300 Dysuria: Secondary | ICD-10-CM

## 2017-11-07 DIAGNOSIS — K5909 Other constipation: Secondary | ICD-10-CM

## 2017-11-07 LAB — URINALYSIS, ROUTINE W REFLEX MICROSCOPIC
BILIRUBIN URINE: NEGATIVE
Glucose, UA: NEGATIVE mg/dL
KETONES UR: NEGATIVE mg/dL
NITRITE: NEGATIVE
PH: 6 (ref 5.0–8.0)
Protein, ur: NEGATIVE mg/dL
SPECIFIC GRAVITY, URINE: 1.011 (ref 1.005–1.030)

## 2017-11-07 LAB — PREGNANCY, URINE: Preg Test, Ur: NEGATIVE

## 2017-11-07 MED ORDER — KETOROLAC TROMETHAMINE 30 MG/ML IJ SOLN
30.0000 mg | Freq: Once | INTRAMUSCULAR | Status: AC
  Administered 2017-11-07: 30 mg via INTRAMUSCULAR
  Filled 2017-11-07: qty 1

## 2017-11-07 MED ORDER — CEPHALEXIN 500 MG PO CAPS: 500.0000 mg | ORAL_CAPSULE | Freq: Three times a day (TID) | ORAL | 0 refills | Status: DC

## 2017-11-07 MED ORDER — PEG 3350-KCL-NABCB-NACL-NASULF 236 G PO SOLR: ORAL | 0 refills | Status: DC

## 2017-11-07 NOTE — ED Provider Notes (Signed)
[facility] EMERGENCY DEPARTMENT Provider Note   CSN: [id number] Arrival date & time: 11/06/17  2129     History   Chief Complaint Chief Complaint  Patient presents with  . Abdominal Pain    HPI Kimberly Kline is a 32 y.o. female.  HPI  This is a 32 year old female who presents with 3 to 4-day history of constipation and abdominal bloating.  Patient reports that she has not had a bowel movement in 3 to 4 days.  She has had increasing abdominal bloating and "gas."  She states she is uncomfortable laying on her back or her stomach.  She rates pain at 7 out of 10.  She reports that she has taken a laxative and an enema with minimal relief.  She does not normally have issues with constipation.  She denies any prior abdominal surgeries.  She reports nausea without vomiting.  No fevers.  She does report some burning with urination.  No hematuria or back pain.  Past Medical History:  Diagnosis Date  . GERD (gastroesophageal reflux disease)     There are no active problems to display for this patient.   Past Surgical History:  Procedure Laterality Date  . TUBAL LIGATION  2013     OB History    Gravida  3   Para  3   Term  3   Preterm      AB      Living  3     SAB      TAB      Ectopic      Multiple      Live Births               Home Medications    Prior to Admission medications   Medication Sig Start Date End Date Taking? Authorizing Provider  cephALEXin (KEFLEX) 500 MG capsule Take 1 capsule (500 mg total) by mouth 3 (three) times daily. 11/07/17   Ossiel Marchio, Mayer Masker, MD  polyethylene glycol (GOLYTELY) 236 g solution Drink 8 oz every 15-30 minutes until stools are loose and/or prep is complete 11/07/17   Cathi Hazan, Mayer Masker, MD    Family History Family History  Problem Relation Age of Onset  . Hypertension Mother   . Diabetes Mother   . ADD / ADHD Son   . Diabetes Maternal Grandmother   . Hypertension Maternal Grandmother   . Stroke  Maternal Grandmother   . Diabetes Maternal Grandfather   . Hypertension Maternal Grandfather   . Asthma Son   . Asthma Son     Social History Social History   Tobacco Use  . Smoking status: Former Smoker    Types: Cigars  . Smokeless tobacco: Never Used  . Tobacco comment: 2x week   Substance Use Topics  . Alcohol use: No    Frequency: Never    Comment: 2x week   . Drug use: No     Allergies   Patient has no known allergies.   Review of Systems Review of Systems  Constitutional: Negative for fever.  Respiratory: Negative for shortness of breath.   Cardiovascular: Negative for chest pain.  Gastrointestinal: Positive for abdominal pain, constipation and nausea. Negative for blood in stool, diarrhea and vomiting.  Genitourinary: Positive for dysuria. Negative for flank pain and hematuria.  Musculoskeletal: Negative for back pain.  All other systems reviewed and are negative.    Physical Exam Updated Vital Signs BP 137/88   Pulse 66   Temp 98.4 F (36.9  C)   Resp 17   Ht 1.6 m (5\' 3" )   Wt 83.9 kg   LMP 10/26/2017   SpO2 99%   BMI 32.77 kg/m   Physical Exam  Constitutional: She is oriented to person, place, and time. She appears well-developed and well-nourished. No distress.  HENT:  Head: Normocephalic and atraumatic.  Eyes: Pupils are equal, round, and reactive to light.  Neck: Neck supple.  Cardiovascular: Normal rate, regular rhythm and normal heart sounds.  Pulmonary/Chest: Effort normal. No respiratory distress. She has no wheezes.  Abdominal: Soft. Bowel sounds are normal. There is no tenderness. There is no rebound and no guarding.  Neurological: She is alert and oriented to person, place, and time.  Skin: Skin is warm and dry.  Psychiatric: She has a normal mood and affect.  Nursing note and vitals reviewed.    ED Treatments / Results  Labs (all labs ordered are listed, but only abnormal results are displayed) Labs Reviewed  COMPREHENSIVE  METABOLIC PANEL - Abnormal; Notable for the following components:      Result Value   Total Protein 8.2 (*)    AST 14 (*)    All other components within normal limits  CBC - Abnormal; Notable for the following components:   Hemoglobin 11.8 (*)    All other components within normal limits  URINALYSIS, ROUTINE W REFLEX MICROSCOPIC - Abnormal; Notable for the following components:   Color, Urine AMBER (*)    APPearance HAZY (*)    Hgb urine dipstick SMALL (*)    Leukocytes, UA LARGE (*)    Bacteria, UA RARE (*)    All other components within normal limits  URINE CULTURE  LIPASE, BLOOD  PREGNANCY, URINE    EKG None  Radiology Dg Abdomen 1 View  Result Date: 11/07/2017 CLINICAL DATA:  Constipation and abdominal pain. EXAM: ABDOMEN - 1 VIEW COMPARISON:  CT 02/14/2017 FINDINGS: Normal bowel gas pattern. No bowel dilatation to suggest obstruction. Moderate stool in the ascending and transverse colon, no significant formed stool in the descending or rectosigmoid colon. No evidence of free air. Multiple phleboliths without radiopaque calculi. Lung bases are clear. No osseous abnormalities. IMPRESSION: Normal bowel gas pattern.  Moderate stool in the proximal colon. Electronically Signed   By: Narda Rutherford M.D.   On: 11/07/2017 01:46    Procedures Procedures (including critical care time)  Medications Ordered in ED Medications  ketorolac (TORADOL) 30 MG/ML injection 30 mg (30 mg Intramuscular Given 11/07/17 0135)     Initial Impression / Assessment and Plan / ED Course  I have reviewed the triage vital signs and the nursing notes.  Pertinent labs & imaging results that were available during my care of the patient were reviewed by me and considered in my medical decision making (see chart for details).     Presents with constipation, abdominal bloating, dysuria.  She is overall nontoxic-appearing vital signs are reassuring.  Her exam is fairly benign without any reproducible  pain.  She denies any obstructive symptoms including vomiting.  She does report 1 day of dysuria.  No fevers or back pain to suggest pyelonephritis.  KUB shows moderate stool burden.  Otherwise no evidence of obstruction.  Basic lab work is largely reassuring.  Patient does have large leukocyte esterase and rare bacteria in her urine.  Given symptoms, would elect to culture and treat.  Suspect patient's symptoms are related to constipation.  Discussed with patient more aggressive routes for cleanout.  Patient opted for GoLYTELY.  After history, exam, and medical workup I feel the patient has been appropriately medically screened and is safe for discharge home. Pertinent diagnoses were discussed with the patient. Patient was given return precautions.   Final Clinical Impressions(s) / ED Diagnoses   Final diagnoses:  Other constipation  Dysuria    ED Discharge Orders         Ordered    polyethylene glycol (GOLYTELY) 236 g solution     11/07/17 0222    cephALEXin (KEFLEX) 500 MG capsule  3 times daily     11/07/17 0222           Shon Baton, MD 11/07/17 [phone]

## 2017-11-08 LAB — URINE CULTURE: Culture: NO GROWTH

## 2017-11-28 DIAGNOSIS — K353 Acute appendicitis with localized peritonitis, without perforation or gangrene: Secondary | ICD-10-CM

## 2017-11-28 HISTORY — DX: Acute appendicitis with localized peritonitis, without perforation or gangrene: K35.30

## 2018-08-20 ENCOUNTER — Telehealth: Payer: Self-pay | Admitting: Obstetrics & Gynecology

## 2018-08-20 NOTE — Telephone Encounter (Signed)

## 2018-08-21 ENCOUNTER — Ambulatory Visit (INDEPENDENT_AMBULATORY_CARE_PROVIDER_SITE_OTHER): Payer: Self-pay | Admitting: Obstetrics & Gynecology

## 2018-08-21 ENCOUNTER — Other Ambulatory Visit: Payer: Self-pay

## 2018-08-21 ENCOUNTER — Other Ambulatory Visit (HOSPITAL_COMMUNITY)
Admission: RE | Admit: 2018-08-21 | Discharge: 2018-08-21 | Disposition: A | Discharge status: Home / Self Care | Payer: Medicaid Other | Source: Ambulatory Visit | Attending: Obstetrics & Gynecology | Admitting: Obstetrics & Gynecology

## 2018-08-21 ENCOUNTER — Encounter: Payer: Self-pay | Admitting: Obstetrics & Gynecology

## 2018-08-21 VITALS — BP 111/73 | HR 75 | Ht 63.0 in | Wt 200.0 lb

## 2018-08-21 DIAGNOSIS — N946 Dysmenorrhea, unspecified: Secondary | ICD-10-CM

## 2018-08-21 DIAGNOSIS — Z124 Encounter for screening for malignant neoplasm of cervix: Secondary | ICD-10-CM | POA: Diagnosis not present

## 2018-08-21 DIAGNOSIS — N920 Excessive and frequent menstruation with regular cycle: Secondary | ICD-10-CM

## 2018-08-21 MED ORDER — MEGESTROL ACETATE 40 MG PO TABS: ORAL_TABLET | ORAL | 3 refills | Status: DC

## 2018-08-21 NOTE — Progress Notes (Signed)
Chief Complaint  Patient presents with  . Menorrhagia    have pain rt groin area when walk      33 y.o. [id number] Patient's last menstrual period was 08/03/2018. The current method of family planning is none.  Outpatient Encounter Medications as of 08/21/2018  Medication Sig  . cephALEXin (KEFLEX) 500 MG capsule Take 1 capsule (500 mg total) by mouth 3 (three) times daily.  . megestrol (MEGACE) 40 MG tablet 3 tablets a day for 5 days, 2 tablets a day for 5 days then 1 tablet daily  . polyethylene glycol (GOLYTELY) 236 g solution Drink 8 oz every 15-30 minutes until stools are loose and/or prep is complete (Patient not taking: Reported on 08/21/2018)   No facility-administered encounter medications on file as of 08/21/2018.     Subjective Pt has had increasingly painful heavy menses since birth of her last child 6 years ago Worsening over time Makes her nauseated Has to leave work due to pain No dyspareunia Has clots RLQ pain associated right groin Bleeds regularly every month 6 days Past Medical History:  Diagnosis Date  . GERD (gastroesophageal reflux disease)     Past Surgical History:  Procedure Laterality Date  . TUBAL LIGATION  2013    OB History    Gravida  3   Para  3   Term  3   Preterm      AB      Living  3     SAB      TAB      Ectopic      Multiple      Live Births              No Known Allergies  Social History   Socioeconomic History  . Marital status: Single    Spouse name: Not on file  . Number of children: 3  . Years of education: 42  . Highest education level: Not on file  Occupational History  . Occupation: assembly    Employer: [employer]  . Financial resource strain: Not on file  . Food insecurity    Worry: Not on file    Inability: Not on file  . Transportation needs    Medical: Not on file    Non-medical: Not on file  Tobacco Use  . Smoking status: Former Smoker    Types: Cigars  .  Smokeless tobacco: Never Used  . Tobacco comment: 2x week   Substance and Sexual Activity  . Alcohol use: No    Frequency: Never    Comment: 2x week   . Drug use: No  . Sexual activity: Not Currently    Birth control/protection: Surgical  Lifestyle  . Physical activity    Days per week: Not on file    Minutes per session: Not on file  . Stress: Not on file  Relationships  . Social Herbalist on phone: Not on file    Gets together: Not on file    Attends religious service: Not on file    Active member of club or organization: Not on file    Attends meetings of clubs or organizations: Not on file    Relationship status: Not on file  Other Topics Concern  . Not on file  Social History Narrative  . Not on file    Family History  Problem Relation Age of Onset  . Hypertension Mother   . Diabetes Mother   .  ADD / ADHD Son   . Diabetes Maternal Grandmother   . Hypertension Maternal Grandmother   . Stroke Maternal Grandmother   . Diabetes Maternal Grandfather   . Hypertension Maternal Grandfather   . Asthma Son   . Asthma Son     Medications:       Current Outpatient Medications:  .  cephALEXin (KEFLEX) 500 MG capsule, Take 1 capsule (500 mg total) by mouth 3 (three) times daily., Disp: 21 capsule, Rfl: 0 .  megestrol (MEGACE) 40 MG tablet, 3 tablets a day for 5 days, 2 tablets a day for 5 days then 1 tablet daily, Disp: 45 tablet, Rfl: 3 .  polyethylene glycol (GOLYTELY) 236 g solution, Drink 8 oz every 15-30 minutes until stools are loose and/or prep is complete (Patient not taking: Reported on 08/21/2018), Disp: 4000 mL, Rfl: 0  Objective Blood pressure 111/73, pulse 75, height 5\' 3"  (1.6 m), weight 200 lb (90.7 kg), last menstrual period 08/03/2018.  General WDWN female NAD Vulva:  normal appearing vulva with no masses, tenderness or lesions Vagina:  normal mucosa, no discharge Cervix:  Normal no lesions Pap done Uterus:  normal size, contour, position,  consistency, mobility, non-tender Adnexa: ovaries:present,  normal adnexa in size, nontender and no masses   Pertinent ROS No burning with urination, frequency or urgency No nausea, vomiting or diarrhea Nor fever chills or other constitutional symptoms   Labs or studies     Impression Diagnoses this Encounter::   ICD-10-CM   1. Menorrhagia with regular cycle  N92.0 US PELVIS (TRANSABDOMINAL ONLY)    US PELVIS TRANSVAGINAL NON-OB (TV ONLY)  2. Dysmenorrhea  N94.6 US PELVIS (TRANSABDOMINAL ONLY)    US PELVIS TRANSVAGINAL NON-OB (TV ONLY)    Established relevant diagnosis(es):   Plan/Recommendations: Meds ordered this encounter  Medications  . megestrol (MEGACE) 40 MG tablet    Sig: 3 tablets a day for 5 days, 2 tablets a day for 5 days then 1 tablet daily    Dispense:  45 tablet    Refill:  3    Labs or Scans Ordered: Orders Placed This Encounter  Procedures  . US PELVIS (TRANSABDOMINAL ONLY)  . US PELVIS TRANSVAGINAL NON-OB (TV ONLY)    Management:: >megestrol to suppress her menses >sonogram 1 month for endometrial evaluation, no fibroids appreciated on exam At that time will discuss options for cycle management going forward  Follow up Return in about 1 month (around 09/21/2018) for GYN sono, Follow up, with Dr Despina HiddenEure.       All questions were answered.

## 2018-08-21 NOTE — Addendum Note (Signed)
Addended by: Diona Fanti A on: 08/21/2018 02:43 PM   Modules accepted: Orders

## 2018-08-21 NOTE — Addendum Note (Signed)
Addended by: Diona Fanti A on: 08/21/2018 02:41 PM   Modules accepted: Orders

## 2018-08-27 LAB — CYTOLOGY - PAP
Diagnosis: NEGATIVE
HPV: NOT DETECTED

## 2018-09-21 ENCOUNTER — Ambulatory Visit (INDEPENDENT_AMBULATORY_CARE_PROVIDER_SITE_OTHER): Payer: Self-pay

## 2018-09-21 ENCOUNTER — Telehealth: Payer: Self-pay | Admitting: Obstetrics & Gynecology

## 2018-09-21 ENCOUNTER — Other Ambulatory Visit: Payer: Self-pay

## 2018-09-21 DIAGNOSIS — N946 Dysmenorrhea, unspecified: Secondary | ICD-10-CM

## 2018-09-21 DIAGNOSIS — N920 Excessive and frequent menstruation with regular cycle: Secondary | ICD-10-CM

## 2018-09-21 NOTE — Progress Notes (Signed)
PELVIC US TA/TV: homogeneous anteverted uterus,wnl,EEC 5.7 mm,normal ovaries bilat,ovaries appear mobile,trace of cul de sac fluid,no pain during ultrasound

## 2018-09-21 NOTE — Telephone Encounter (Signed)
Patient had an [place] today and she would like a phone call from Dr. Elonda Husky with the results. Please contact pt

## 2018-09-21 NOTE — Telephone Encounter (Signed)
Sent mychart message informing patient results wouldn't be ready today.

## 2018-11-27 ENCOUNTER — Other Ambulatory Visit: Payer: Self-pay | Admitting: Obstetrics & Gynecology

## 2018-11-27 ENCOUNTER — Telehealth: Payer: Self-pay | Admitting: *Deleted

## 2018-11-27 NOTE — Telephone Encounter (Signed)
Patient left message that she has a concern about the megestrol she has been on for four months. Wants a call back.

## 2018-11-27 NOTE — Telephone Encounter (Signed)
Pt states that she has been spotting after sex wonders if this related to the Megace. Advised that she may want to do STD screen. Pt doesn't currently have medicaid and wanted to know how much a self swab would be. Advised patient that I would check with front desk and call her tomorrow with price. Patient agreeable and we will discuss tomorrow morning.

## 2018-11-29 NOTE — Telephone Encounter (Signed)
Called patient back. She states that she just isn't doing well with the megace. She still bleeds and is gaining weight. She is ready to discuss other options to manage her bleeding. Appt scheduled with Dr. Elonda Husky for next week.

## 2018-12-03 ENCOUNTER — Encounter: Payer: Self-pay | Admitting: Obstetrics & Gynecology

## 2018-12-03 ENCOUNTER — Other Ambulatory Visit: Payer: Self-pay

## 2018-12-03 ENCOUNTER — Ambulatory Visit (INDEPENDENT_AMBULATORY_CARE_PROVIDER_SITE_OTHER): Payer: Self-pay | Admitting: Obstetrics & Gynecology

## 2018-12-03 VITALS — BP 131/91 | HR 65 | Ht 63.0 in | Wt 202.0 lb

## 2018-12-03 DIAGNOSIS — N946 Dysmenorrhea, unspecified: Secondary | ICD-10-CM

## 2018-12-03 DIAGNOSIS — N920 Excessive and frequent menstruation with regular cycle: Secondary | ICD-10-CM

## 2018-12-03 NOTE — Progress Notes (Signed)
Follow up appointment for results  Chief Complaint  Patient presents with  . Follow-up    still bleeding on megace. wants to discuss other options    Blood pressure (!) 131/91, pulse 65, height 5\' 3"  (1.6 m), weight 202 lb (91.6 kg).  GYNECOLOGIC SONOGRAM   Kimberly Kline is a 33 y.o. [id number] LMP 09/13/2018,she is here for a pelvic sonogram for menorrhagia.  Uterus                      8.5 x 5.9 x 7.2 cm, Total uterine volume 187 cc, homogeneous anteverted uterus,wnl  Endometrium          5.7 mm, symmetrical, wnl  Right ovary             1.9 x 3.3 x 1.8 cm, wnl  Left ovary                3.2 x 2.3 x 1.6 cm, wnl    Technician Comments:  PELVIC US TA/TV: homogeneous anteverted uterus,wnl,EEC 5.7 mm,normal ovaries bilat,ovaries appear mobile,trace of cul de sac fluid,no pain during ultrasound     [employer] 09/21/2018 10:19 AM  Clinical Impression and recommendations:  I have reviewed the sonogram results above.  Being performed due to heavy menses, currently on cycle day 11  Combined with the patient's current clinical course, below are my impressions and any appropriate recommendations for management based on the sonographic findings: 1.  Anteverted symmetric uterus with homogenous myometrial tissues and a thin endometrial stripe.  On transverse views there on the right side there is a small fibroid along the lower uterine segment and a similar small fibroid on the right.  There is no abnormality in the blood flow within the myometrium.  Ovaries are small bilaterally 2.  Minimal cul-de-sac fluid considered  normal  FINDINGS:  Kimberly Kline 09/25/2018     MEDS ordered this encounter: No orders of the defined types were placed in this encounter.   Orders for this encounter: No orders of the defined types were placed in this encounter.   Impression: 1. Menorrhagia with regular cycle   2. Dysmenorrhea   Plan: Discussed options IUD vs OCP  vs Depo Vs ablation and pt opts for endometrial ablation  Follow Up: No follow-ups on file.       Face to face time:  15 minutes  Greater than 50% of the visit time was spent in counseling and coordination of care with the patient.  The summary and outline of the counseling and care coordination is summarized in the note above.   All questions were answered.  Past Medical History:  Diagnosis Date  . GERD (gastroesophageal reflux disease)     Past Surgical History:  Procedure Laterality Date  . TUBAL LIGATION  2013    OB History    Gravida  3   Para  3   Term  3   Preterm      AB      Living  3     SAB      TAB      Ectopic      Multiple      Live Births              No Known Allergies  Social History   Socioeconomic History  . Marital status: Single    Spouse name: Not on file  . Number of children: 3  . Years of education: 45  .  Highest education level: Not on file  Occupational History  . Occupation: assembly    Employer: [employer]  Social Needs  . Financial resource strain: Not on file  . Food insecurity    Worry: Not on file    Inability: Not on file  . Transportation needs    Medical: Not on file    Non-medical: Not on file  Tobacco Use  . Smoking status: Former Smoker    Types: Cigars  . Smokeless tobacco: Never Used  . Tobacco comment: 2x week   Substance and Sexual Activity  . Alcohol use: No    Frequency: Never    Comment: 2x week   . Drug use: No  . Sexual activity: Not Currently    Birth control/protection: Surgical  Lifestyle  . Physical activity    Days per week: Not on file    Minutes per session: Not on file  . Stress: Not on file  Relationships  . Social Musician on phone: Not on file    Gets together: Not on file    Attends religious service: Not on file    Active member of club or organization: Not on file    Attends meetings of clubs or organizations: Not on file    Relationship  status: Not on file  Other Topics Concern  . Not on file  Social History Narrative  . Not on file    Family History  Problem Relation Age of Onset  . Hypertension Mother   . Diabetes Mother   . ADD / ADHD Son   . Diabetes Maternal Grandmother   . Hypertension Maternal Grandmother   . Stroke Maternal Grandmother   . Diabetes Maternal Grandfather   . Hypertension Maternal Grandfather   . Asthma Son   . Asthma Son

## 2018-12-21 NOTE — Patient Instructions (Signed)
Kimberly Kline  12/21/2018     @PREFPERIOPPHARMACY @   Your procedure is scheduled on  12/26/2018   Report to [facility] at  1000   A.M.  Call this number if you have problems the morning of surgery:  [phone]   Remember:  Do not eat or drink after midnight.                         Take these medicines the morning of surgery with A SIP OF WATER  None    Do not wear jewelry, make-up or nail polish.  Do not wear lotions, powders, or perfumes. Please wear deodorant and brush your teeth.  Do not shave 48 hours prior to surgery.  Men may shave face and neck.  Do not bring valuables to the hospital.  [facility] is not responsible for any belongings or valuables.  Contacts, dentures or bridgework may not be worn into surgery.  Leave your suitcase in the car.  After surgery it may be brought to your room.  For patients admitted to the hospital, discharge time will be determined by your treatment team.  Patients discharged the day of surgery will not be allowed to drive home.   Name and phone number of your driver:   family Special instructions:  None  Please read over the following fact sheets that you were given. Anesthesia Post-op Instructions and Care and Recovery After Surgery       Dilation and Curettage or Vacuum Curettage, Care After These instructions give you information about caring for yourself after your procedure. Your doctor may also give you more specific instructions. Call your doctor if you have any problems or questions after your procedure. Follow these instructions at home: Activity  Do not drive or use heavy machinery while taking prescription pain medicine.  For 24 hours after your procedure, avoid driving.  Take short walks often, followed by rest periods. Ask your doctor what activities are safe for you. After one or two days, you may be able to return to your normal activities.  Do not lift anything that is heavier than 10  lb (4.5 kg) until your doctor approves.  For at least 2 weeks, or as long as told by your doctor: ? Do not douche. ? Do not use tampons. ? Do not have sex. General instructions   Take over-the-counter and prescription medicines only as told by your doctor. This is very important if you take blood thinning medicine.  Do not take baths, swim, or use a hot tub until your doctor approves. Take showers instead of baths.  Wear compression stockings as told by your doctor.  It is up to you to get the results of your procedure. Ask your doctor when your results will be ready.  Keep all follow-up visits as told by your doctor. This is important. Contact a doctor if:  You have very bad cramps that get worse or do not get better with medicine.  You have very bad pain in your belly (abdomen).  You cannot drink fluids without throwing up (vomiting).  You get pain in a different part of the area between your belly and thighs (pelvis).  You have bad-smelling discharge from your vagina.  You have a rash. Get help right away if:  You are bleeding a lot from your vagina. A lot of bleeding means soaking more than one sanitary pad  in an hour, for 2 hours in a row.  You have clumps of blood (blood clots) coming from your vagina.  You have a fever or chills.  Your belly feels very tender or hard.  You have chest pain.  You have trouble breathing.  You cough up blood.  You feel dizzy.  You feel light-headed.  You pass out (faint).  You have pain in your neck or shoulder area. Summary  Take short walks often, followed by rest periods. Ask your doctor what activities are safe for you. After one or two days, you may be able to return to your normal activities.  Do not lift anything that is heavier than 10 lb (4.5 kg) until your doctor approves.  Do not take baths, swim, or use a hot tub until your doctor approves. Take showers instead of baths.  Contact your doctor if you have any  symptoms of infection, like bad-smelling discharge from your vagina. This information is not intended to replace advice given to you by your health care provider. Make sure you discuss any questions you have with your health care provider. Document Released: 10/13/2007 Document Revised: 12/16/2016 Document Reviewed: 09/21/2015 Elsevier Patient Education  2020 Elsevier Inc[employer]  Endometrial Ablation Endometrial ablation is a procedure that destroys the thin inner layer of the lining of the uterus (endometrium). This procedure may be done:  To stop heavy periods.  To stop bleeding that is causing anemia.  To control irregular bleeding.  To treat bleeding caused by small tumors (fibroids) in the endometrium. This procedure is often an alternative to major surgery, such as removal of the uterus and cervix (hysterectomy). As a result of this procedure:  You may not be able to have children. However, if you are premenopausal (you have not gone through menopause): ? You may still have a small chance of getting pregnant. ? You will need to use a reliable method of birth control after the procedure to prevent pregnancy.  You may stop having a menstrual period, or you may have only a small amount of bleeding during your period. Menstruation may return several years after the procedure. Tell a health care provider about:  Any allergies you have.  All medicines you are taking, including vitamins, herbs, eye drops, creams, and over-the-counter medicines.  Any problems you or family members have had with the use of anesthetic medicines.  Any blood disorders you have.  Any surgeries you have had.  Any medical conditions you have. What are the risks? Generally, this is a safe procedure. However, problems may occur, including:  A hole (perforation) in the uterus or bowel.  Infection of the uterus, bladder, or vagina.  Bleeding.  Damage to other structures or organs.  An air bubble in the  lung (air embolus).  Problems with pregnancy after the procedure.  Failure of the procedure.  Decreased ability to diagnose cancer in the endometrium. What happens before the procedure?  You will have tests of your endometrium to make sure there are no pre-cancerous cells or cancer cells present.  You may have an ultrasound of the uterus.  You may be given medicines to thin the endometrium.  Ask your health care provider about: ? Changing or stopping your regular medicines. This is especially important if you take diabetes medicines or blood thinners. ? Taking medicines such as aspirin and ibuprofen. These medicines can thin your blood. Do not take these medicines before your procedure if your doctor tells you not to.  Plan to have  someone take you home from the hospital or clinic. What happens during the procedure?   You will lie on an exam table with your feet and legs supported as in a pelvic exam.  To lower your risk of infection: ? Your health care team will wash or sanitize their hands and put on germ-free (sterile) gloves. ? Your genital area will be washed with soap.  An IV tube will be inserted into one of your veins.  You will be given a medicine to help you relax (sedative).  A surgical instrument with a light and camera (resectoscope) will be inserted into your vagina and moved into your uterus. This allows your surgeon to see inside your uterus.  Endometrial tissue will be removed using one of the following methods: ? Radiofrequency. This method uses a radiofrequency-alternating electric current to remove the endometrium. ? Cryotherapy. This method uses extreme cold to freeze the endometrium. ? Heated-free liquid. This method uses a heated saltwater (saline) solution to remove the endometrium. ? Microwave. This method uses high-energy microwaves to heat up the endometrium and remove it. ? Thermal balloon. This method involves inserting a catheter with a balloon  tip into the uterus. The balloon tip is filled with heated fluid to remove the endometrium. The procedure may vary among health care providers and hospitals. What happens after the procedure?  Your blood pressure, heart rate, breathing rate, and blood oxygen level will be monitored until the medicines you were given have worn off.  As tissue healing occurs, you may notice vaginal bleeding for 4-6 weeks after the procedure. You may also experience: ? Cramps. ? Thin, watery vaginal discharge that is light pink or brown in color. ? A need to urinate more frequently than usual. ? Nausea.  Do not drive for 24 hours if you were given a sedative.  Do not have sex or insert anything into your vagina until your health care provider approves. Summary  Endometrial ablation is done to treat the many causes of heavy menstrual bleeding.  The procedure may be done only after medications have been tried to control the bleeding.  Plan to have someone take you home from the hospital or clinic. This information is not intended to replace advice given to you by your health care provider. Make sure you discuss any questions you have with your health care provider. Document Released: 11/13/2003 Document Revised: 06/20/2017 Document Reviewed: 01/21/2016 Elsevier Patient Education  2020 Elsevier Inc.  Hysteroscopy, Care After This sheet gives you information about how to care for yourself after your procedure. Your health care provider may also give you more specific instructions. If you have problems or questions, contact your health care provider. What can I expect after the procedure? After the procedure, it is common to have:  Cramping.  Bleeding. This can vary from light spotting to menstrual-like bleeding. Follow these instructions at home: Activity  Rest for 1-2 days after the procedure.  Do not douche, use tampons, or have sex for 2 weeks after the procedure, or until your health care  provider approves.  Do not drive for 24 hours after the procedure, or for as long as told by your health care provider.  Do not drive, use heavy machinery, or drink alcohol while taking prescription pain medicines. Medicines   Take over-the-counter and prescription medicines only as told by your health care provider.  Do not take aspirin during recovery. It can increase the risk of bleeding. General instructions  Do not take baths, swim, or  use a hot tub until your health care provider approves. Take showers instead of baths for 2 weeks, or for as long as told by your health care provider.  To prevent or treat constipation while you are taking prescription pain medicine, your health care provider may recommend that you: ? Drink enough fluid to keep your urine clear or pale yellow. ? Take over-the-counter or prescription medicines. ? Eat foods that are high in fiber, such as fresh fruits and vegetables, whole grains, and beans. ? Limit foods that are high in fat and processed sugars, such as fried and sweet foods.  Keep all follow-up visits as told by your health care provider. This is important. Contact a health care provider if:  You feel dizzy or lightheaded.  You feel nauseous.  You have abnormal vaginal discharge.  You have a rash.  You have pain that does not get better with medicine.  You have chills. Get help right away if:  You have bleeding that is heavier than a normal menstrual period.  You have a fever.  You have pain or cramps that get worse.  You develop new abdominal pain.  You faint.  You have pain in your shoulders.  You have shortness of breath. Summary  After the procedure, you may have cramping and some vaginal bleeding.  Do not douche, use tampons, or have sex for 2 weeks after the procedure, or until your health care provider approves.  Do not take baths, swim, or use a hot tub until your health care provider approves. Take showers instead  of baths for 2 weeks, or for as long as told by your health care provider.  Report any unusual symptoms to your health care provider.  Keep all follow-up visits as told by your health care provider. This is important. This information is not intended to replace advice given to you by your health care provider. Make sure you discuss any questions you have with your health care provider. Document Released: 10/24/2012 Document Revised: 12/16/2016 Document Reviewed: 02/02/2016 Elsevier Patient Education  2020 Elsevier Inc.  General Anesthesia, Adult, Care After This sheet gives you information about how to care for yourself after your procedure. Your health care provider may also give you more specific instructions. If you have problems or questions, contact your health care provider. What can I expect after the procedure? After the procedure, the following side effects are common:  Pain or discomfort at the IV site.  Nausea.  Vomiting.  Sore throat.  Trouble concentrating.  Feeling cold or chills.  Weak or tired.  Sleepiness and fatigue.  Soreness and body aches. These side effects can affect parts of the body that were not involved in surgery. Follow these instructions at home:  For at least 24 hours after the procedure:  Have a responsible adult stay with you. It is important to have someone help care for you until you are awake and alert.  Rest as needed.  Do not: ? Participate in activities in which you could fall or become injured. ? Drive. ? Use heavy machinery. ? Drink alcohol. ? Take sleeping pills or medicines that cause drowsiness. ? Make important decisions or sign legal documents. ? Take care of children on your own. Eating and drinking  Follow any instructions from your health care provider about eating or drinking restrictions.  When you feel hungry, start by eating small amounts of foods that are soft and easy to digest (bland), such as toast. Gradually  return to your  regular diet.  Drink enough fluid to keep your urine pale yellow.  If you vomit, rehydrate by drinking water, juice, or clear broth. General instructions  If you have sleep apnea, surgery and certain medicines can increase your risk for breathing problems. Follow instructions from your health care provider about wearing your sleep device: ? Anytime you are sleeping, including during daytime naps. ? While taking prescription pain medicines, sleeping medicines, or medicines that make you drowsy.  Return to your normal activities as told by your health care provider. Ask your health care provider what activities are safe for you.  Take over-the-counter and prescription medicines only as told by your health care provider.  If you smoke, do not smoke without supervision.  Keep all follow-up visits as told by your health care provider. This is important. Contact a health care provider if:  You have nausea or vomiting that does not get better with medicine.  You cannot eat or drink without vomiting.  You have pain that does not get better with medicine.  You are unable to pass urine.  You develop a skin rash.  You have a fever.  You have redness around your IV site that gets worse. Get help right away if:  You have difficulty breathing.  You have chest pain.  You have blood in your urine or stool, or you vomit blood. Summary  After the procedure, it is common to have a sore throat or nausea. It is also common to feel tired.  Have a responsible adult stay with you for the first 24 hours after general anesthesia. It is important to have someone help care for you until you are awake and alert.  When you feel hungry, start by eating small amounts of foods that are soft and easy to digest (bland), such as toast. Gradually return to your regular diet.  Drink enough fluid to keep your urine pale yellow.  Return to your normal activities as told by your health care  provider. Ask your health care provider what activities are safe for you. This information is not intended to replace advice given to you by your health care provider. Make sure you discuss any questions you have with your health care provider. Document Released: 04/11/2000 Document Revised: 01/06/2017 Document Reviewed: 08/19/2016 Elsevier Patient Education  2020 [employer]. How to Use Chlorhexidine for Bathing Chlorhexidine gluconate (CHG) is a germ-killing (antiseptic) solution that is used to clean the skin. It can get rid of the bacteria that normally live on the skin and can keep them away for about 24 hours. To clean your skin with CHG, you may be given:  A CHG solution to use in the shower or as part of a sponge bath.  A prepackaged cloth that contains CHG. Cleaning your skin with CHG may help lower the risk for infection:  While you are staying in the intensive care unit of the hospital.  If you have a vascular access, such as a central line, to provide short-term or long-term access to your veins.  If you have a catheter to drain urine from your bladder.  If you are on a ventilator. A ventilator is a machine that helps you breathe by moving air in and out of your lungs.  After surgery. What are the risks? Risks of using CHG include:  A skin reaction.  Hearing loss, if CHG gets in your ears.  Eye injury, if CHG gets in your eyes and is not rinsed out.  The CHG product  catching fire. Make sure that you avoid smoking and flames after applying CHG to your skin. Do not use CHG:  If you have a chlorhexidine allergy or have previously reacted to chlorhexidine.  On babies younger than 82 months of age. How to use CHG solution  Use CHG only as told by your health care provider, and follow the instructions on the label.  Use the full amount of CHG as directed. Usually, this is one bottle. During a shower Follow these steps when using CHG solution during a shower (unless  your health care provider gives you different instructions): 1. Start the shower. 2. Use your normal soap and shampoo to wash your face and hair. 3. Turn off the shower or move out of the shower stream. 4. Pour the CHG onto a clean washcloth. Do not use any type of brush or rough-edged sponge. 5. Starting at your neck, lather your body down to your toes. Make sure you follow these instructions: ? If you will be having surgery, pay special attention to the part of your body where you will be having surgery. Scrub this area for at least 1 minute. ? Do not use CHG on your head or face. If the solution gets into your ears or eyes, rinse them well with water. ? Avoid your genital area. ? Avoid any areas of skin that have broken skin, cuts, or scrapes. ? Scrub your back and under your arms. Make sure to wash skin folds. 6. Let the lather sit on your skin for 1-2 minutes or as long as told by your health care provider. 7. Thoroughly rinse your entire body in the shower. Make sure that all body creases and crevices are rinsed well. 8. Dry off with a clean towel. Do not put any substances on your body afterward-such as powder, lotion, or perfume-unless you are told to do so by your health care provider. Only use lotions that are recommended by the manufacturer. 9. Put on clean clothes or pajamas. 10. If it is the night before your surgery, sleep in clean sheets.  During a sponge bath Follow these steps when using CHG solution during a sponge bath (unless your health care provider gives you different instructions): 1. Use your normal soap and shampoo to wash your face and hair. 2. Pour the CHG onto a clean washcloth. 3. Starting at your neck, lather your body down to your toes. Make sure you follow these instructions: ? If you will be having surgery, pay special attention to the part of your body where you will be having surgery. Scrub this area for at least 1 minute. ? Do not use CHG on your head or  face. If the solution gets into your ears or eyes, rinse them well with water. ? Avoid your genital area. ? Avoid any areas of skin that have broken skin, cuts, or scrapes. ? Scrub your back and under your arms. Make sure to wash skin folds. 4. Let the lather sit on your skin for 1-2 minutes or as long as told by your health care provider. 5. Using a different clean, wet washcloth, thoroughly rinse your entire body. Make sure that all body creases and crevices are rinsed well. 6. Dry off with a clean towel. Do not put any substances on your body afterward-such as powder, lotion, or perfume-unless you are told to do so by your health care provider. Only use lotions that are recommended by the manufacturer. 7. Put on clean clothes or pajamas. 8. If  it is the night before your surgery, sleep in clean sheets. How to use CHG prepackaged cloths  Only use CHG cloths as told by your health care provider, and follow the instructions on the label.  Use the CHG cloth on clean, dry skin.  Do not use the CHG cloth on your head or face unless your health care provider tells you to.  When washing with the CHG cloth: ? Avoid your genital area. ? Avoid any areas of skin that have broken skin, cuts, or scrapes. Before surgery Follow these steps when using a CHG cloth to clean before surgery (unless your health care provider gives you different instructions): 1. Using the CHG cloth, vigorously scrub the part of your body where you will be having surgery. Scrub using a back-and-forth motion for 3 minutes. The area on your body should be completely wet with CHG when you are done scrubbing. 2. Do not rinse. Discard the cloth and let the area air-dry. Do not put any substances on the area afterward, such as powder, lotion, or perfume. 3. Put on clean clothes or pajamas. 4. If it is the night before your surgery, sleep in clean sheets.  For general bathing Follow these steps when using CHG cloths for general  bathing (unless your health care provider gives you different instructions). 1. Use a separate CHG cloth for each area of your body. Make sure you wash between any folds of skin and between your fingers and toes. Wash your body in the following order, switching to a new cloth after each step: ? The front of your neck, shoulders, and chest. ? Both of your arms, under your arms, and your hands. ? Your stomach and groin area, avoiding the genitals. ? Your right leg and foot. ? Your left leg and foot. ? The back of your neck, your back, and your buttocks. 2. Do not rinse. Discard the cloth and let the area air-dry. Do not put any substances on your body afterward-such as powder, lotion, or perfume-unless you are told to do so by your health care provider. Only use lotions that are recommended by the manufacturer. 3. Put on clean clothes or pajamas. Contact a health care provider if:  Your skin gets irritated after scrubbing.  You have questions about using your solution or cloth. Get help right away if:  Your eyes become very red or swollen.  Your eyes itch badly.  Your skin itches badly and is red or swollen.  Your hearing changes.  You have trouble seeing.  You have swelling or tingling in your mouth or throat.  You have trouble breathing.  You swallow any chlorhexidine. Summary  Chlorhexidine gluconate (CHG) is a germ-killing (antiseptic) solution that is used to clean the skin. Cleaning your skin with CHG may help to lower your risk for infection.  You may be given CHG to use for bathing. It may be in a bottle or in a prepackaged cloth to use on your skin. Carefully follow your health care provider's instructions and the instructions on the product label.  Do not use CHG if you have a chlorhexidine allergy.  Contact your health care provider if your skin gets irritated after scrubbing. This information is not intended to replace advice given to you by your health care  provider. Make sure you discuss any questions you have with your health care provider. Document Released: 09/28/2011 Document Revised: 03/22/2018 Document Reviewed: 12/01/2016 Elsevier Patient Education  2020 [employer].

## 2018-12-24 ENCOUNTER — Encounter (HOSPITAL_COMMUNITY)
Admission: RE | Admit: 2018-12-24 | Discharge: 2018-12-24 | Disposition: A | Discharge status: Home / Self Care | Payer: Self-pay | Source: Ambulatory Visit | Attending: Obstetrics & Gynecology | Admitting: Obstetrics & Gynecology

## 2018-12-24 ENCOUNTER — Other Ambulatory Visit: Payer: Self-pay | Admitting: Obstetrics & Gynecology

## 2018-12-24 ENCOUNTER — Other Ambulatory Visit (HOSPITAL_COMMUNITY)
Admission: RE | Admit: 2018-12-24 | Discharge: 2018-12-24 | Disposition: A | Discharge status: Home / Self Care | Payer: Self-pay | Source: Ambulatory Visit | Attending: Obstetrics & Gynecology | Admitting: Obstetrics & Gynecology

## 2018-12-24 ENCOUNTER — Encounter (HOSPITAL_COMMUNITY): Payer: Self-pay

## 2018-12-24 ENCOUNTER — Other Ambulatory Visit: Payer: Self-pay

## 2018-12-24 DIAGNOSIS — Z01818 Encounter for other preprocedural examination: Secondary | ICD-10-CM | POA: Insufficient documentation

## 2018-12-24 DIAGNOSIS — Z20828 Contact with and (suspected) exposure to other viral communicable diseases: Secondary | ICD-10-CM | POA: Insufficient documentation

## 2018-12-24 DIAGNOSIS — N920 Excessive and frequent menstruation with regular cycle: Secondary | ICD-10-CM | POA: Insufficient documentation

## 2018-12-24 DIAGNOSIS — N946 Dysmenorrhea, unspecified: Secondary | ICD-10-CM | POA: Insufficient documentation

## 2018-12-24 DIAGNOSIS — Z01812 Encounter for preprocedural laboratory examination: Secondary | ICD-10-CM | POA: Insufficient documentation

## 2018-12-24 LAB — CBC
HCT: 40.6 % (ref 36.0–46.0)
Hemoglobin: 13 g/dL (ref 12.0–15.0)
MCH: 31.2 pg (ref 26.0–34.0)
MCHC: 32 g/dL (ref 30.0–36.0)
MCV: 97.4 fL (ref 80.0–100.0)
Platelets: 253 10*3/uL (ref 150–400)
RBC: 4.17 MIL/uL (ref 3.87–5.11)
RDW: 13.2 % (ref 11.5–15.5)
WBC: 5.8 10*3/uL (ref 4.0–10.5)
nRBC: 0 % (ref 0.0–0.2)

## 2018-12-24 LAB — COMPREHENSIVE METABOLIC PANEL
ALT: 31 U/L (ref 0–44)
AST: 20 U/L (ref 15–41)
Albumin: 3.9 g/dL (ref 3.5–5.0)
Alkaline Phosphatase: 40 U/L (ref 38–126)
Anion gap: 10 (ref 5–15)
BUN: 14 mg/dL (ref 6–20)
CO2: 22 mmol/L (ref 22–32)
Calcium: 9.3 mg/dL (ref 8.9–10.3)
Chloride: 104 mmol/L (ref 98–111)
Creatinine, Ser: 0.83 mg/dL (ref 0.44–1.00)
GFR calc Af Amer: >60 mL/min (ref >60)
GFR calc non Af Amer: >60 mL/min (ref >60)
Glucose, Bld: 90 mg/dL (ref 70–99)
Potassium: 4 mmol/L (ref 3.5–5.1)
Sodium: 136 mmol/L (ref 135–145)
Total Bilirubin: 0.4 mg/dL (ref 0.3–1.2)
Total Protein: 8.1 g/dL (ref 6.5–8.1)

## 2018-12-24 LAB — URINALYSIS, ROUTINE W REFLEX MICROSCOPIC
Bacteria, UA: NONE SEEN
Bilirubin Urine: NEGATIVE
Glucose, UA: NEGATIVE mg/dL
Ketones, ur: NEGATIVE mg/dL
Leukocytes,Ua: NEGATIVE
Nitrite: NEGATIVE
Protein, ur: NEGATIVE mg/dL
Specific Gravity, Urine: 1.02 (ref 1.005–1.030)
pH: 6 (ref 5.0–8.0)

## 2018-12-24 LAB — HCG, QUANTITATIVE, PREGNANCY: hCG, Beta Chain, Quant, S: <1 m[IU]/mL (ref <5)

## 2018-12-24 LAB — RAPID HIV SCREEN (HIV 1/2 AB+AG)
HIV 1/2 Antibodies: NONREACTIVE
HIV-1 P24 Antigen - HIV24: NONREACTIVE

## 2018-12-24 LAB — SARS CORONAVIRUS 2 (TAT 6-24 HRS): SARS Coronavirus 2: NEGATIVE

## 2018-12-26 ENCOUNTER — Ambulatory Visit (HOSPITAL_COMMUNITY): Payer: Self-pay | Admitting: Anesthesiology

## 2018-12-26 ENCOUNTER — Encounter (HOSPITAL_COMMUNITY): Payer: Self-pay | Admitting: *Deleted

## 2018-12-26 ENCOUNTER — Encounter (HOSPITAL_COMMUNITY)
Admission: RE | Disposition: A | Discharge status: Home / Self Care | Payer: Self-pay | Source: Home / Self Care | Attending: Obstetrics & Gynecology

## 2018-12-26 ENCOUNTER — Ambulatory Visit (HOSPITAL_COMMUNITY)
Admission: RE | Admit: 2018-12-26 | Discharge: 2018-12-26 | Disposition: A | Discharge status: Home / Self Care | Payer: Self-pay | Attending: Obstetrics & Gynecology | Admitting: Obstetrics & Gynecology

## 2018-12-26 ENCOUNTER — Other Ambulatory Visit: Payer: Self-pay

## 2018-12-26 DIAGNOSIS — N946 Dysmenorrhea, unspecified: Secondary | ICD-10-CM | POA: Diagnosis not present

## 2018-12-26 DIAGNOSIS — K219 Gastro-esophageal reflux disease without esophagitis: Secondary | ICD-10-CM | POA: Insufficient documentation

## 2018-12-26 DIAGNOSIS — Z833 Family history of diabetes mellitus: Secondary | ICD-10-CM | POA: Insufficient documentation

## 2018-12-26 DIAGNOSIS — Z825 Family history of asthma and other chronic lower respiratory diseases: Secondary | ICD-10-CM | POA: Insufficient documentation

## 2018-12-26 DIAGNOSIS — Z823 Family history of stroke: Secondary | ICD-10-CM | POA: Insufficient documentation

## 2018-12-26 DIAGNOSIS — N920 Excessive and frequent menstruation with regular cycle: Secondary | ICD-10-CM | POA: Insufficient documentation

## 2018-12-26 DIAGNOSIS — N921 Excessive and frequent menstruation with irregular cycle: Secondary | ICD-10-CM

## 2018-12-26 DIAGNOSIS — Z818 Family history of other mental and behavioral disorders: Secondary | ICD-10-CM | POA: Insufficient documentation

## 2018-12-26 DIAGNOSIS — Z87891 Personal history of nicotine dependence: Secondary | ICD-10-CM | POA: Insufficient documentation

## 2018-12-26 DIAGNOSIS — Z8249 Family history of ischemic heart disease and other diseases of the circulatory system: Secondary | ICD-10-CM | POA: Insufficient documentation

## 2018-12-26 HISTORY — PX: DILATATION AND CURETTAGE/HYSTEROSCOPY WITH MINERVA: SHX6851

## 2018-12-26 SURGERY — DILATATION AND CURETTAGE/HYSTEROSCOPY WITH MINERVA
Anesthesia: General

## 2018-12-26 MED ORDER — FENTANYL CITRATE (PF) 100 MCG/2ML IJ SOLN
INTRAMUSCULAR | Status: DC | PRN
  Administered 2018-12-26: 50 ug via INTRAVENOUS

## 2018-12-26 MED ORDER — 0.9 % SODIUM CHLORIDE (POUR BTL) OPTIME
TOPICAL | Status: DC | PRN
  Administered 2018-12-26: 1000 mL

## 2018-12-26 MED ORDER — HYDROMORPHONE HCL 1 MG/ML IJ SOLN: 0.2500 mg | INTRAMUSCULAR | Status: DC | PRN

## 2018-12-26 MED ORDER — FENTANYL CITRATE (PF) 250 MCG/5ML IJ SOLN
INTRAMUSCULAR | Status: AC
  Filled 2018-12-26: qty 5

## 2018-12-26 MED ORDER — HYDROCODONE-ACETAMINOPHEN 5-325 MG PO TABS: 1.0000 | ORAL_TABLET | Freq: Four times a day (QID) | ORAL | 0 refills | Status: DC | PRN

## 2018-12-26 MED ORDER — ONDANSETRON 8 MG PO TBDP: 8.0000 mg | ORAL_TABLET | Freq: Three times a day (TID) | ORAL | 0 refills | Status: DC | PRN

## 2018-12-26 MED ORDER — ONDANSETRON HCL 4 MG/2ML IJ SOLN
INTRAMUSCULAR | Status: DC | PRN
  Administered 2018-12-26: 4 mg via INTRAVENOUS

## 2018-12-26 MED ORDER — KETOROLAC TROMETHAMINE 10 MG PO TABS: 10.0000 mg | ORAL_TABLET | Freq: Three times a day (TID) | ORAL | 0 refills | Status: DC | PRN

## 2018-12-26 MED ORDER — SUGAMMADEX SODIUM 500 MG/5ML IV SOLN
INTRAVENOUS | Status: AC
  Filled 2018-12-26: qty 5

## 2018-12-26 MED ORDER — KETOROLAC TROMETHAMINE 30 MG/ML IJ SOLN
30.0000 mg | Freq: Once | INTRAMUSCULAR | Status: AC
  Administered 2018-12-26: 30 mg via INTRAVENOUS
  Filled 2018-12-26: qty 1

## 2018-12-26 MED ORDER — LACTATED RINGERS IV SOLN
Freq: Once | INTRAVENOUS | Status: AC
  Administered 2018-12-26: 11:00:00 via INTRAVENOUS

## 2018-12-26 MED ORDER — MIDAZOLAM HCL 2 MG/2ML IJ SOLN
INTRAMUSCULAR | Status: AC
  Filled 2018-12-26: qty 2

## 2018-12-26 MED ORDER — LACTATED RINGERS IV SOLN
INTRAVENOUS | Status: DC | PRN
  Administered 2018-12-26: 12:00:00 via INTRAVENOUS

## 2018-12-26 MED ORDER — PROPOFOL 10 MG/ML IV BOLUS
INTRAVENOUS | Status: AC
  Filled 2018-12-26: qty 40

## 2018-12-26 MED ORDER — ONDANSETRON HCL 4 MG/2ML IJ SOLN: 4.0000 mg | Freq: Once | INTRAMUSCULAR | Status: DC | PRN

## 2018-12-26 MED ORDER — PROPOFOL 10 MG/ML IV BOLUS
INTRAVENOUS | Status: DC | PRN
  Administered 2018-12-26: 180 mg via INTRAVENOUS

## 2018-12-26 MED ORDER — ONDANSETRON HCL 4 MG/2ML IJ SOLN
INTRAMUSCULAR | Status: AC
  Filled 2018-12-26: qty 2

## 2018-12-26 MED ORDER — MEPERIDINE HCL 50 MG/ML IJ SOLN: 6.2500 mg | INTRAMUSCULAR | Status: DC | PRN

## 2018-12-26 MED ORDER — CEFAZOLIN SODIUM-DEXTROSE 2-4 GM/100ML-% IV SOLN
2.0000 g | INTRAVENOUS | Status: AC
  Administered 2018-12-26: 2 g via INTRAVENOUS
  Filled 2018-12-26: qty 100

## 2018-12-26 MED ORDER — SODIUM CHLORIDE 0.9 % IR SOLN
Status: DC | PRN
  Administered 2018-12-26: 3000 mL

## 2018-12-26 MED ORDER — LIDOCAINE HCL (CARDIAC) PF 50 MG/5ML IV SOSY
PREFILLED_SYRINGE | INTRAVENOUS | Status: DC | PRN
  Administered 2018-12-26: 60 mg via INTRAVENOUS

## 2018-12-26 SURGICAL SUPPLY — 29 items
CLOTH BEACON ORANGE TIMEOUT ST (SAFETY) ×2 IMPLANT
COVER LIGHT HANDLE STERIS (MISCELLANEOUS) ×4 IMPLANT
GAUZE 4X4 16PLY RFD (DISPOSABLE) ×4 IMPLANT
GLOVE BIOGEL PI IND STRL 6.5 (GLOVE) ×1 IMPLANT
GLOVE BIOGEL PI IND STRL 7.0 (GLOVE) ×2 IMPLANT
GLOVE BIOGEL PI IND STRL 8 (GLOVE) ×1 IMPLANT
GLOVE BIOGEL PI INDICATOR 6.5 (GLOVE) ×1
GLOVE BIOGEL PI INDICATOR 7.0 (GLOVE) ×2
GLOVE BIOGEL PI INDICATOR 8 (GLOVE) ×1
GLOVE ECLIPSE 8.0 STRL XLNG CF (GLOVE) ×2 IMPLANT
GLOVE SURG SS PI 6.5 STRL IVOR (GLOVE) ×2 IMPLANT
GOWN STRL REUS W/TWL LRG LVL3 (GOWN DISPOSABLE) ×2 IMPLANT
GOWN STRL REUS W/TWL XL LVL3 (GOWN DISPOSABLE) ×2 IMPLANT
HANDPIECE ABLA MINERVA ENDO (MISCELLANEOUS) ×2 IMPLANT
INST SET HYSTEROSCOPY (KITS) ×2 IMPLANT
IV NS 1000ML (IV SOLUTION) ×1
IV NS 1000ML BAXH (IV SOLUTION) ×1 IMPLANT
KIT TURNOVER CYSTO (KITS) ×2 IMPLANT
MANIFOLD NEPTUNE II (INSTRUMENTS) ×2 IMPLANT
MARKER SKIN DUAL TIP RULER LAB (MISCELLANEOUS) ×2 IMPLANT
NS IRRIG 1000ML POUR BTL (IV SOLUTION) ×2 IMPLANT
PACK BASIC III (CUSTOM PROCEDURE TRAY) ×1
PACK SRG BSC III STRL LF ECLPS (CUSTOM PROCEDURE TRAY) ×1 IMPLANT
PAD ARMBOARD 7.5X6 YLW CONV (MISCELLANEOUS) ×2 IMPLANT
PAD TELFA 3X4 1S STER (GAUZE/BANDAGES/DRESSINGS) ×2 IMPLANT
SET BASIN LINEN APH (SET/KITS/TRAYS/PACK) ×2 IMPLANT
SET IRRIG Y TYPE TUR BLADDER L (SET/KITS/TRAYS/PACK) ×2 IMPLANT
SHEET LAVH (DRAPES) ×2 IMPLANT
YANKAUER SUCT BULB TIP 10FT TU (MISCELLANEOUS) ×2 IMPLANT

## 2018-12-26 NOTE — Anesthesia Postprocedure Evaluation (Signed)
Anesthesia Post Note  Patient: Kimberly Kline  Procedure(s) Performed: DILATATION/HYSTEROSCOPY WITH MINERVA ENDOMETRIAL ABLATION (N/A )  Patient location during evaluation: PACU Anesthesia Type: General Level of consciousness: oriented and awake and alert Pain management: pain level controlled Vital Signs Assessment: post-procedure vital signs reviewed and stable Respiratory status: spontaneous breathing Cardiovascular status: blood pressure returned to baseline and stable Postop Assessment: no apparent nausea or vomiting Anesthetic complications: no     Last Vitals:  Vitals:   12/26/18 1400 12/26/18 1415  BP: 116/79 (!) 138/91  Pulse: 82 62  Resp: 17 19  Temp:    SpO2: 100% 100%    Last Pain:  Vitals:   12/26/18 1415  TempSrc:   PainSc: 0-No pain                 Tynesia Harral

## 2018-12-26 NOTE — Op Note (Signed)
Preoperative diagnosis:  1.   menmetrorrhagia                                         2.  Dysmenorrhea   Postoperative diagnoses: Same as above   Procedure: Hysteroscopy, endometrial ablation using Minerva  Surgeon: Florian Buff   Anesthesia: Laryngeal mask airway  Findings: The endometrium was normal. There were no fibroid or other abnormalities.  Description of operation: The patient was taken to the operating room and placed in the supine position. She underwent general anesthesia using the laryngeal mask airway. She was placed in the dorsal lithotomy position and prepped and draped in the usual sterile fashion. A Graves speculum was placed and the anterior cervical lip was grasped with a single-tooth tenaculum. The cervix was dilated serially to allow passage of the hysteroscope. Diagnostic hysteroscopy was performed and was found to be normal.   I then proceeded to perform the Minerva endometrial ablation.   The uterus sounded to 9 cm The handpiece was attached to the Minerva power source/machine and the handpiece passed the checklist. The array was squeezed down to remove all of the air present.  The array was then place into the endometrial cavity and deployed to a length of 6 cm. The handpiece confirmed appropriate width by being in the green portion of the visual dial. The cervical cuff was then inflated to the point the CO2 indicator was in the green. The endometrial integrity check was then performed and integrity sequence was confirmed x 2. The heating was then begun and carried out for a total of 2 minutes(which is standard therapy time). When the plasma cycle was finished,  the cervical cuff was deflated and the array was removed with tissue present on the silicon membrane. There was appropriate post Minerva bleeding and uterine discharge.     All of the equipment worked well throughout the procedure.  The patient was awakened from anesthesia and taken to the recovery room  in good stable condition all counts were correct. She received 2 g of Ancef and 30 mg of Toradol preoperatively. She will be discharged from the recovery room and followed up in the office in 1- 2 weeks.   She can expect 4 weeks of post procedure bloody watery discharge  Florian Buff, MD  12/26/2018 1:18 PM

## 2018-12-26 NOTE — Transfer of Care (Signed)
Immediate Anesthesia Transfer of Care Note  Patient: Kimberly Kline  Procedure(s) Performed: DILATATION/HYSTEROSCOPY WITH MINERVA ENDOMETRIAL ABLATION (N/A )  Patient Location: PACU  Anesthesia Type:General  Level of Consciousness: awake  Airway & Oxygen Therapy: Patient Spontanous Breathing  Post-op Assessment: Report given to RN  Post vital signs: Reviewed and stable  Last Vitals:  Vitals Value Taken Time  BP 122/84 12/26/18 1330  Temp    Pulse 73 12/26/18 1330  Resp 18 12/26/18 1330  SpO2 100 % 12/26/18 1330  Vitals shown include unvalidated device data.  Last Pain:  Vitals:   12/26/18 1327  TempSrc:   PainSc: (P) 0-No pain      Patients Stated Pain Goal: (P) 4 (83/09/40 7680)  Complications: No apparent anesthesia complications

## 2018-12-26 NOTE — Anesthesia Procedure Notes (Signed)
Procedure Name: LMA Insertion Date/Time: 12/26/2018 12:54 PM Performed by: Ollen Bowl, CRNA Pre-anesthesia Checklist: Patient identified, Patient being monitored, Emergency Drugs available, Timeout performed and Suction available Patient Re-evaluated:Patient Re-evaluated prior to induction Oxygen Delivery Method: Circle System Utilized Preoxygenation: Pre-oxygenation with 100% oxygen Induction Type: IV induction Ventilation: Mask ventilation without difficulty LMA: LMA inserted LMA Size: 3.0 Number of attempts: 1 Placement Confirmation: positive ETCO2 and breath sounds checked- equal and bilateral

## 2018-12-26 NOTE — Anesthesia Preprocedure Evaluation (Signed)
Anesthesia Evaluation  Patient identified by MRN, date of birth, ID band Patient awake    Reviewed: Allergy & Precautions, NPO status , Patient's Chart, lab work & pertinent test results  Airway Mallampati: III  TM Distance: >3 FB Neck ROM: Full    Dental no notable dental hx.    Pulmonary former smoker,    Pulmonary exam normal breath sounds clear to auscultation       Cardiovascular negative cardio ROS Normal cardiovascular exam Rhythm:Regular Rate:Normal     Neuro/Psych negative psych ROS   GI/Hepatic Neg liver ROS, GERD  Medicated and Controlled,  Endo/Other  negative endocrine ROS  Renal/GU negative Renal ROS     Musculoskeletal negative musculoskeletal ROS (+)   Abdominal   Peds  Hematology   Anesthesia Other Findings   Reproductive/Obstetrics negative OB ROS                             Anesthesia Physical Anesthesia Plan  ASA: II  Anesthesia Plan: General   Post-op Pain Management:    Induction: Intravenous  PONV Risk Score and Plan: 4 or greater and Ondansetron, Dexamethasone and Midazolam  Airway Management Planned: LMA  Additional Equipment:   Intra-op Plan:   Post-operative Plan: Extubation in OR  Informed Consent: I have reviewed the patients History and Physical, chart, labs and discussed the procedure including the risks, benefits and alternatives for the proposed anesthesia with the patient or authorized representative who has indicated his/her understanding and acceptance.     Dental advisory given  Plan Discussed with: CRNA  Anesthesia Plan Comments:         Anesthesia Quick Evaluation

## 2018-12-26 NOTE — Discharge Instructions (Signed)
Wound Care, Adult Taking care of your wound properly can help to prevent pain, infection, and scarring. It can also help your wound to heal more quickly. How to care for your wound Wound care     Follow instructions from your health care provider about how to take care of your wound. Make sure you: Wash your hands with soap and water before you change the bandage (dressing). If soap and water are not available, use hand sanitizer. Change your dressing as told by your health care provider. Leave stitches (sutures), skin glue, or adhesive strips in place. These skin closures may need to stay in place for 2 weeks or longer. If adhesive strip edges start to loosen and curl up, you may trim the loose edges. Do not remove adhesive strips completely unless your health care provider tells you to do that. Check your wound area every day for signs of infection. Check for: Redness, swelling, or pain. Fluid or blood. Warmth. Pus or a bad smell. Ask your health care provider if you should clean the wound with mild soap and water. Doing this may include: Using a clean towel to pat the wound dry after cleaning it. Do not rub or scrub the wound. Applying a cream or ointment. Do this only as told by your health care provider. Covering the incision with a clean dressing. Ask your health care provider when you can leave the wound uncovered. Keep the dressing dry until your health care provider says it can be removed. Do not take baths, swim, use a hot tub, or do anything that would put the wound underwater until your health care provider approves. Ask your health care provider if you can take showers. You may only be allowed to take sponge baths. Medicines  If you were prescribed an antibiotic medicine, cream, or ointment, take or use the antibiotic as told by your health care provider. Do not stop taking or using the antibiotic even if your condition improves. Take over-the-counter and prescription medicines  only as told by your health care provider. If you were prescribed pain medicine, take it 30 or more minutes before you do any wound care or as told by your health care provider. General instructions Return to your normal activities as told by your health care provider. Ask your health care provider what activities are safe. Do not scratch or pick at the wound. Do not use any products that contain nicotine or tobacco, such as cigarettes and e-cigarettes. These may delay wound healing. If you need help quitting, ask your health care provider. Keep all follow-up visits as told by your health care provider. This is important. Eat a diet that includes protein, vitamin A, vitamin C, and other nutrient-rich foods to help the wound heal. Foods rich in protein include meat, dairy, beans, nuts, and other sources. Foods rich in vitamin A include carrots and dark green, leafy vegetables. Foods rich in vitamin C include citrus, tomatoes, and other fruits and vegetables. Nutrient-rich foods have protein, carbohydrates, fat, vitamins, or minerals. Eat a variety of healthy foods including vegetables, fruits, and whole grains. Contact a health care provider if: You received a tetanus shot and you have swelling, severe pain, redness, or bleeding at the injection site. Your pain is not controlled with medicine. You have redness, swelling, or pain around the wound. You have fluid or blood coming from the wound. Your wound feels warm to the touch. You have pus or a bad smell coming from the wound. You have a  fever or chills. You are nauseous or you vomit. You are dizzy. Get help right away if: You have a red streak going away from your wound. The edges of the wound open up and separate. Your wound is bleeding, and the bleeding does not stop with gentle pressure. You have a rash. You faint. You have trouble breathing. Summary Always wash your hands with soap and water before changing your bandage  (dressing). To help with healing, eat foods that are rich in protein, vitamin A, vitamin C, and other nutrients. Check your wound every day for signs of infection. Contact your health care provider if you suspect that your wound is infected. This information is not intended to replace advice given to you by your health care provider. Make sure you discuss any questions you have with your health care provider. Document Released: 10/13/2007 Document Revised: 04/23/2018 Document Reviewed: 07/21/2015 Elsevier Patient Education  2020 [employer] General Anesthesia, Adult, Care After This sheet gives you information about how to care for yourself after your procedure. Your health care provider may also give you more specific instructions. If you have problems or questions, contact your health care provider. What can I expect after the procedure? After the procedure, the following side effects are common:  Pain or discomfort at the IV site.  Nausea.  Vomiting.  Sore throat.  Trouble concentrating.  Feeling cold or chills.  Weak or tired.  Sleepiness and fatigue.  Soreness and body aches. These side effects can affect parts of the body that were not involved in surgery. Follow these instructions at home:  For at least 24 hours after the procedure:  Have a responsible adult stay with you. It is important to have someone help care for you until you are awake and alert.  Rest as needed.  Do not: ? Participate in activities in which you could fall or become injured. ? Drive. ? Use heavy machinery. ? Drink alcohol. ? Take sleeping pills or medicines that cause drowsiness. ? Make important decisions or sign legal documents. ? Take care of children on your own. Eating and drinking  Follow any instructions from your health care provider about eating or drinking restrictions.  When you feel hungry, start by eating small amounts of foods that are soft and easy to digest (bland), such  as toast. Gradually return to your regular diet.  Drink enough fluid to keep your urine pale yellow.  If you vomit, rehydrate by drinking water, juice, or clear broth. General instructions  If you have sleep apnea, surgery and certain medicines can increase your risk for breathing problems. Follow instructions from your health care provider about wearing your sleep device: ? Anytime you are sleeping, including during daytime naps. ? While taking prescription pain medicines, sleeping medicines, or medicines that make you drowsy.  Return to your normal activities as told by your health care provider. Ask your health care provider what activities are safe for you.  Take over-the-counter and prescription medicines only as told by your health care provider.  If you smoke, do not smoke without supervision.  Keep all follow-up visits as told by your health care provider. This is important. Contact a health care provider if:  You have nausea or vomiting that does not get better with medicine.  You cannot eat or drink without vomiting.  You have pain that does not get better with medicine.  You are unable to pass urine.  You develop a skin rash.  You have a fever.  You have redness around your IV site that gets worse. Get help right away if:  You have difficulty breathing.  You have chest pain.  You have blood in your urine or stool, or you vomit blood. Summary  After the procedure, it is common to have a sore throat or nausea. It is also common to feel tired.  Have a responsible adult stay with you for the first 24 hours after general anesthesia. It is important to have someone help care for you until you are awake and alert.  When you feel hungry, start by eating small amounts of foods that are soft and easy to digest (bland), such as toast. Gradually return to your regular diet.  Drink enough fluid to keep your urine pale yellow.  Return to your normal activities as told  by your health care provider. Ask your health care provider what activities are safe for you. This information is not intended to replace advice given to you by your health care provider. Make sure you discuss any questions you have with your health care provider. Document Released: 04/11/2000 Document Revised: 01/06/2017 Document Reviewed: 08/19/2016 Elsevier Patient Education  2020 [employer].

## 2018-12-26 NOTE — H&P (Signed)
Preoperative History and Physical  Kimberly Kline is a 33 y.o. [phone] with Patient's last menstrual period was 12/24/2018. admitted for a hysteroscopy uterine curettage Minerva endometrial ablation.   Pt has had increasingly painful heavy menses since birth of her last child 6 years ago Worsening over time Makes her nauseated Has to leave work due to pain No dyspareunia Has clots RLQ pain associated right groin Bleeds regularly every month 6 days PMH:    Past Medical History:  Diagnosis Date  . GERD (gastroesophageal reflux disease)     PSH:     Past Surgical History:  Procedure Laterality Date  . APPENDECTOMY    . TUBAL LIGATION  2013    POb/GynH:      OB History    Gravida  3   Para  3   Term  3   Preterm      AB      Living  3     SAB      TAB      Ectopic      Multiple      Live Births              SH:   Social History   Tobacco Use  . Smoking status: Former Smoker    Years: 3.00    Types: Cigars    Quit date: 12/24/2006    Years since quitting: 12.0  . Smokeless tobacco: Never Used  . Tobacco comment: 2x week   Substance Use Topics  . Alcohol use: No    Frequency: Never    Comment: 2x week   . Drug use: No    FH:    Family History  Problem Relation Age of Onset  . Hypertension Mother   . Diabetes Mother   . ADD / ADHD Son   . Diabetes Maternal Grandmother   . Hypertension Maternal Grandmother   . Stroke Maternal Grandmother   . Diabetes Maternal Grandfather   . Hypertension Maternal Grandfather   . Asthma Son   . Asthma Son      Allergies: No Known Allergies  Medications:       Current Facility-Administered Medications:  .  ceFAZolin (ANCEF) IVPB 2g/100 mL premix, 2 g, Intravenous, On Call to OR, Florian Buff, MD  Review of Systems:   Review of Systems  Constitutional: Negative for fever, chills, weight loss, malaise/fatigue and diaphoresis.  HENT: Negative for hearing loss, ear pain, nosebleeds, congestion,  sore throat, neck pain, tinnitus and ear discharge.   Eyes: Negative for blurred vision, double vision, photophobia, pain, discharge and redness.  Respiratory: Negative for cough, hemoptysis, sputum production, shortness of breath, wheezing and stridor.   Cardiovascular: Negative for chest pain, palpitations, orthopnea, claudication, leg swelling and PND.  Gastrointestinal: Positive for abdominal pain. Negative for heartburn, nausea, vomiting, diarrhea, constipation, blood in stool and melena.  Genitourinary: Negative for dysuria, urgency, frequency, hematuria and flank pain.  Musculoskeletal: Negative for myalgias, back pain, joint pain and falls.  Skin: Negative for itching and rash.  Neurological: Negative for dizziness, tingling, tremors, sensory change, speech change, focal weakness, seizures, loss of consciousness, weakness and headaches.  Endo/Heme/Allergies: Negative for environmental allergies and polydipsia. Does not bruise/bleed easily.  Psychiatric/Behavioral: Negative for depression, suicidal ideas, hallucinations, memory loss and substance abuse. The patient is not nervous/anxious and does not have insomnia.      PHYSICAL EXAM:  Blood pressure 120/81, pulse 62, temperature 98.6 F (37 C), temperature source Oral, resp. rate 18,  last menstrual period 12/24/2018, SpO2 97 %.    Vitals reviewed. Constitutional: She is oriented to person, place, and time. She appears well-developed and well-nourished.  HENT:  Head: Normocephalic and atraumatic.  Right Ear: External ear normal.  Left Ear: External ear normal.  Nose: Nose normal.  Mouth/Throat: Oropharynx is clear and moist.  Eyes: Conjunctivae and EOM are normal. Pupils are equal, round, and reactive to light. Right eye exhibits no discharge. Left eye exhibits no discharge. No scleral icterus.  Neck: Normal range of motion. Neck supple. No tracheal deviation present. No thyromegaly present.  Cardiovascular: Normal rate, regular  rhythm, normal heart sounds and intact distal pulses.  Exam reveals no gallop and no friction rub.   No murmur heard. Respiratory: Effort normal and breath sounds normal. No respiratory distress. She has no wheezes. She has no rales. She exhibits no tenderness.  GI: Soft. Bowel sounds are normal. She exhibits no distension and no mass. There is tenderness. There is no rebound and no guarding.  Genitourinary:       Vulva is normal without lesions Vagina is pink moist without discharge Cervix normal in appearance and pap is normal Uterus is normal size, contour, position, consistency, mobility, non-tender Adnexa is negative with normal sized ovaries by sonogram  Musculoskeletal: Normal range of motion. She exhibits no edema and no tenderness.  Neurological: She is alert and oriented to person, place, and time. She has normal reflexes. She displays normal reflexes. No cranial nerve deficit. She exhibits normal muscle tone. Coordination normal.  Skin: Skin is warm and dry. No rash noted. No erythema. No pallor.  Psychiatric: She has a normal mood and affect. Her behavior is normal. Judgment and thought content normal.    Labs: Results for orders placed or performed during the hospital encounter of 12/24/18 (from the past 336 hour(s))  CBC   Collection Time: 12/24/18 11:43 AM  Result Value Ref Range   WBC 5.8 4.0 - 10.5 K/uL   RBC 4.17 3.87 - 5.11 MIL/uL   Hemoglobin 13.0 12.0 - 15.0 g/dL   HCT 02.5 42.7 - 06.2 %   MCV 97.4 80.0 - 100.0 fL   MCH 31.2 26.0 - 34.0 pg   MCHC 32.0 30.0 - 36.0 g/dL   RDW 37.6 28.3 - 15.1 %   Platelets 253 150 - 400 K/uL   nRBC 0.0 0.0 - 0.2 %  Comprehensive metabolic panel   Collection Time: 12/24/18 11:43 AM  Result Value Ref Range   Sodium 136 135 - 145 mmol/L   Potassium 4.0 3.5 - 5.1 mmol/L   Chloride 104 98 - 111 mmol/L   CO2 22 22 - 32 mmol/L   Glucose, Bld 90 70 - 99 mg/dL   BUN 14 6 - 20 mg/dL   Creatinine, Ser 7.61 0.44 - 1.00 mg/dL   Calcium  9.3 8.9 - 60.7 mg/dL   Total Protein 8.1 6.5 - 8.1 g/dL   Albumin 3.9 3.5 - 5.0 g/dL   AST 20 15 - 41 U/L   ALT 31 0 - 44 U/L   Alkaline Phosphatase 40 38 - 126 U/L   Total Bilirubin 0.4 0.3 - 1.2 mg/dL   GFR calc non Af Amer >60 >60 mL/min   GFR calc Af Amer >60 >60 mL/min   Anion gap 10 5 - 15  hCG, quantitative, pregnancy   Collection Time: 12/24/18 11:43 AM  Result Value Ref Range   hCG, Beta Chain, Quant, S <1 <5 mIU/mL  Rapid HIV screen (  HIV 1/2 Ab+Ag)   Collection Time: 12/24/18 11:43 AM  Result Value Ref Range   HIV-1 P24 Antigen - HIV24 NON REACTIVE NON REACTIVE   HIV 1/2 Antibodies NON REACTIVE NON REACTIVE   Interpretation (HIV Ag Ab)      A non reactive test result means that HIV 1 or HIV 2 antibodies and HIV 1 p24 antigen were not detected in the specimen.  Urinalysis, Routine w reflex microscopic   Collection Time: 12/24/18 12:00 PM  Result Value Ref Range   Color, Urine YELLOW YELLOW   APPearance CLEAR CLEAR   Specific Gravity, Urine 1.020 1.005 - 1.030   pH 6.0 5.0 - 8.0   Glucose, UA NEGATIVE NEGATIVE mg/dL   Hgb urine dipstick SMALL (A) NEGATIVE   Bilirubin Urine NEGATIVE NEGATIVE   Ketones, ur NEGATIVE NEGATIVE mg/dL   Protein, ur NEGATIVE NEGATIVE mg/dL   Nitrite NEGATIVE NEGATIVE   Leukocytes,Ua NEGATIVE NEGATIVE   RBC / HPF 0-5 0 - 5 RBC/hpf   WBC, UA 0-5 0 - 5 WBC/hpf   Bacteria, UA NONE SEEN NONE SEEN   Squamous Epithelial / LPF 0-5 0 - 5   Mucus PRESENT   Results for orders placed or performed during the hospital encounter of 12/24/18 (from the past 336 hour(s))  SARS CORONAVIRUS 2 (TAT 6-24 HRS) Nasopharyngeal Nasopharyngeal Swab   Collection Time: 12/24/18  7:23 AM   Specimen: Nasopharyngeal Swab  Result Value Ref Range   SARS Coronavirus 2 NEGATIVE NEGATIVE    EKG: Orders placed or performed during the hospital encounter of 08/16/15  . ED EKG  . ED EKG  . EKG    Imaging Studies: GYNECOLOGIC SONOGRAM   Anastasio ChampionJoshe N. Marbach is a 33  y.o. G3P3003 LMP 09/13/2018,she is here for a pelvic sonogram for menorrhagia.  Uterus                      8.5 x 5.9 x 7.2 cm, Total uterine volume 187 cc, homogeneous anteverted uterus,wnl  Endometrium          5.7 mm, symmetrical, wnl  Right ovary             1.9 x 3.3 x 1.8 cm, wnl  Left ovary                3.2 x 2.3 x 1.6 cm, wnl    Technician Comments:  PELVIC US TA/TV: homogeneous anteverted uterus,wnl,EEC 5.7 mm,normal ovaries bilat,ovaries appear mobile,trace of cul de sac fluid,no pain during ultrasound     E. I. du Pontmber J Carl 09/21/2018 10:19 AM  Clinical Impression and recommendations:  I have reviewed the sonogram results above.  Being performed due to heavy menses, currently on cycle day 11  Combined with the patient's current clinical course, below are my impressions and any appropriate recommendations for management based on the sonographic findings: 1.  Anteverted symmetric uterus with homogenous myometrial tissues and a thin endometrial stripe.  On transverse views there on the right side there is a small fibroid along the lower uterine segment and a similar small fibroid on the right.  There is no abnormality in the blood flow within the myometrium.  Ovaries are small bilaterally 2.  Minimal cul-de-sac fluid considered  normal  FINDINGS:  Tilda BurrowJohn V Ferguson 09/25/2018     Assessment: Menorrhagia dysmenorrhea Good response to megestrol without dyspareunia  Plan: Hysteroscopy uterine curettage Minerva endometrial ablation  Lazaro ArmsLuther H Eure 12/26/2018 12:11 PM

## 2019-01-04 ENCOUNTER — Encounter: Payer: Self-pay | Admitting: Obstetrics & Gynecology

## 2019-01-04 ENCOUNTER — Ambulatory Visit (INDEPENDENT_AMBULATORY_CARE_PROVIDER_SITE_OTHER): Payer: Self-pay | Admitting: Obstetrics & Gynecology

## 2019-01-04 ENCOUNTER — Other Ambulatory Visit: Payer: Self-pay

## 2019-01-04 VITALS — BP 120/86 | HR 75 | Ht 63.0 in | Wt 206.0 lb

## 2019-01-04 DIAGNOSIS — Z9889 Other specified postprocedural states: Secondary | ICD-10-CM

## 2019-01-04 NOTE — Progress Notes (Signed)
  HPI: Patient returns for routine postoperative follow-up having undergone hysteroscopy endo ablation, Minerva, on 12/26/2018.  The patient's immediate postoperative recovery has been unremarkable. Since hospital discharge the patient reports watery discharge and constipation.   Current Outpatient Medications: .  Docusate Sodium (COLACE PO), Take by mouth as needed., Disp: , Rfl:  .  HYDROcodone-acetaminophen (NORCO/VICODIN) 5-325 MG tablet, Take 1 tablet by mouth every 6 (six) hours as needed., Disp: 15 tablet, Rfl: 0 .  ketorolac (TORADOL) 10 MG tablet, Take 1 tablet (10 mg total) by mouth every 8 (eight) hours as needed., Disp: 15 tablet, Rfl: 0  No current facility-administered medications for this visit.    Blood pressure 120/86, pulse 75, height 5\' 3"  (1.6 m), weight 206 lb (93.4 kg), last menstrual period 12/24/2018.  Physical Exam: Normal post ablation exam  Diagnostic Tests:   Pathology: benign  Impression: S/p endo ablation  Plan:   Follow up: prn    Florian Buff, MD

## 2019-01-12 IMAGING — CT CT RENAL STONE PROTOCOL
2 of 4 series · 17 of 46 positions shown, 19 images · non-contrast
Comparison: None.

CLINICAL DATA: Abdominal pain radiating to the left flank for 1
month. Vomiting.

EXAM:
CT ABDOMEN AND PELVIS WITHOUT CONTRAST
TECHNIQUE: Multidetector CT imaging of the abdomen and pelvis was performed
following the standard protocol without IV contrast.

[Series 2: axial st · axial · 0.89mm/px · z∈[+1091,+1501]mm · 14 of 90 slices shown, 16 images]
[im 4/90  soft-tissue]
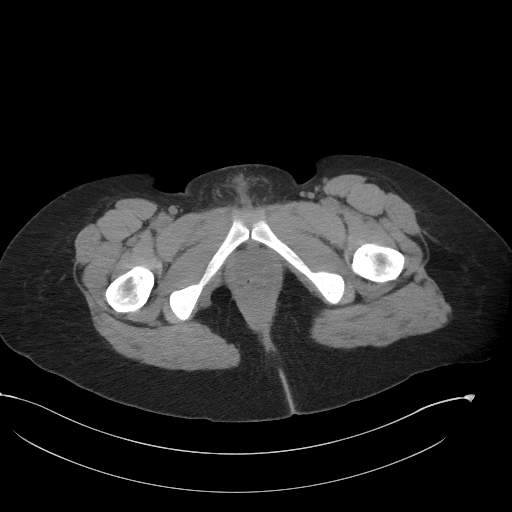
[im 4/90  bone]
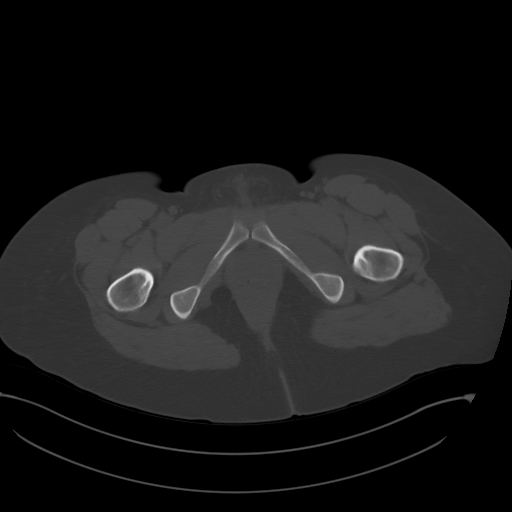
[im 12/90  soft-tissue]
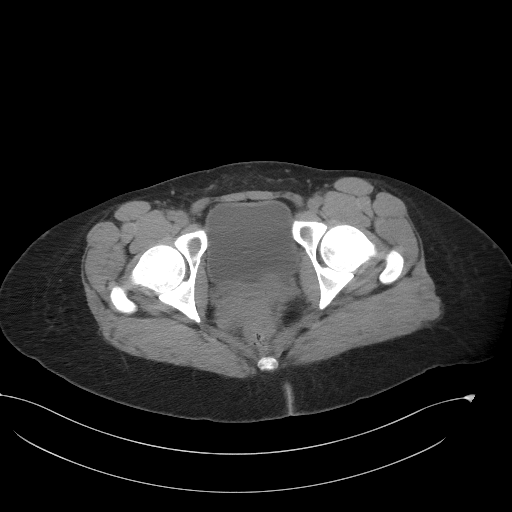
[im 16/90  soft-tissue]
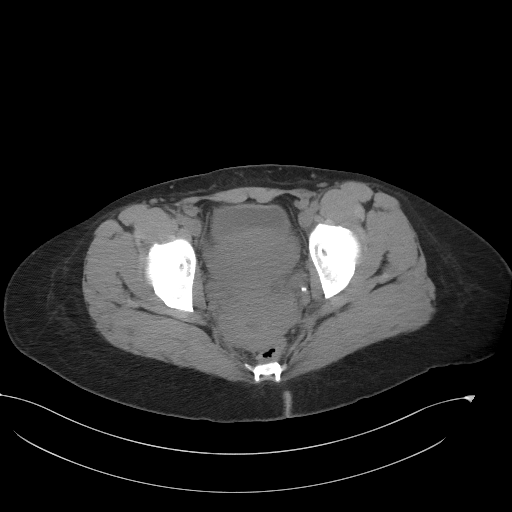
[im 24/90  soft-tissue]
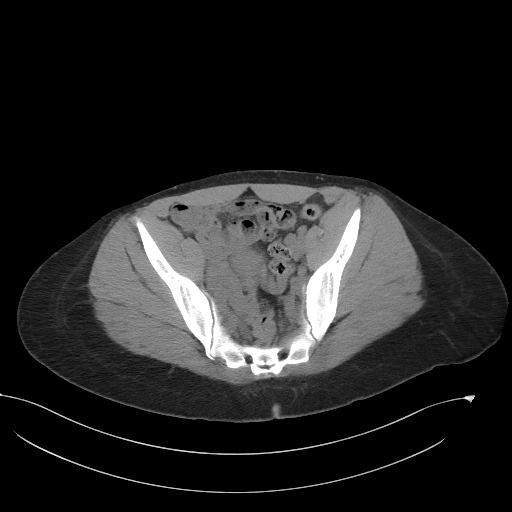
[im 31/90  soft-tissue]
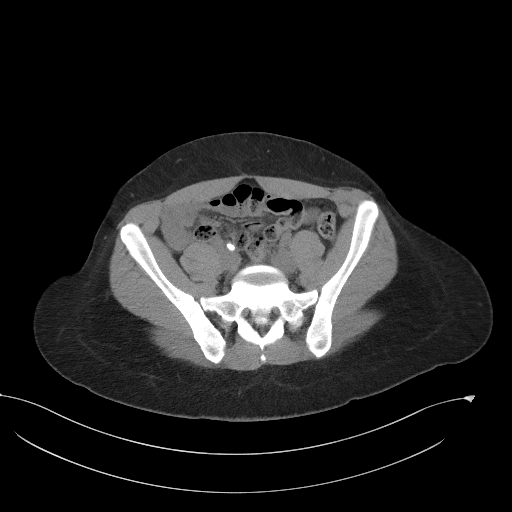
[im 35/90  soft-tissue]
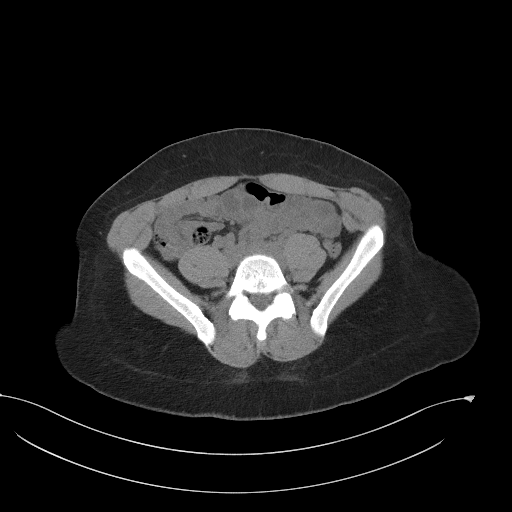
[im 43/90  soft-tissue]
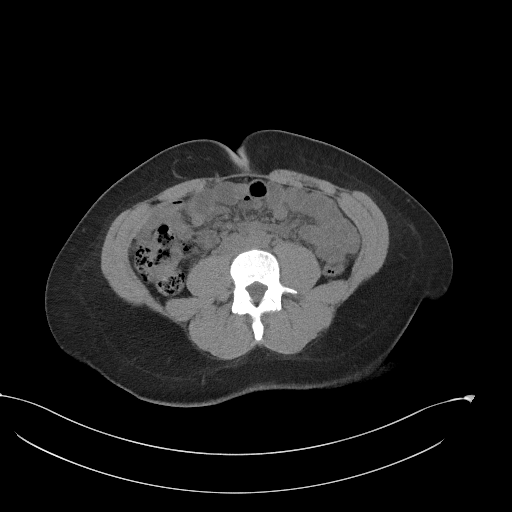
[im 47/90  soft-tissue]
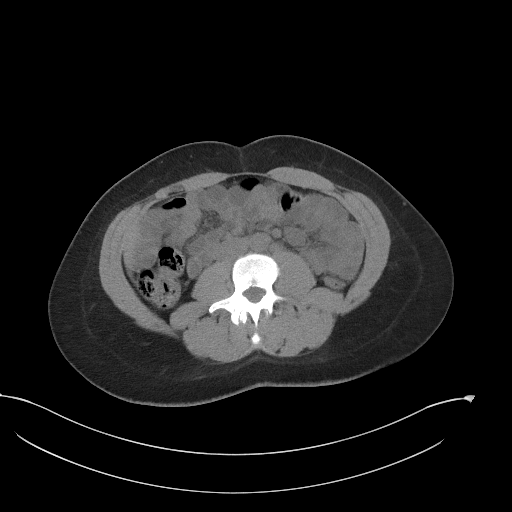
[im 55/90  soft-tissue]
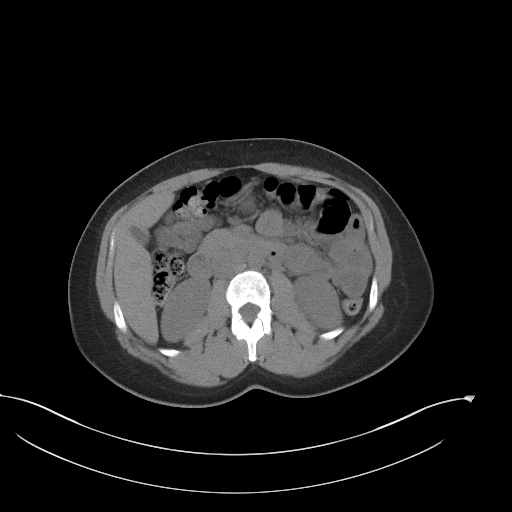
[im 55/90  bone]
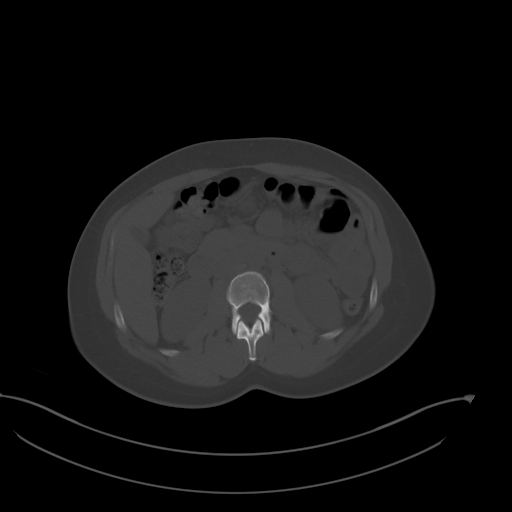
[im 59/90  soft-tissue]
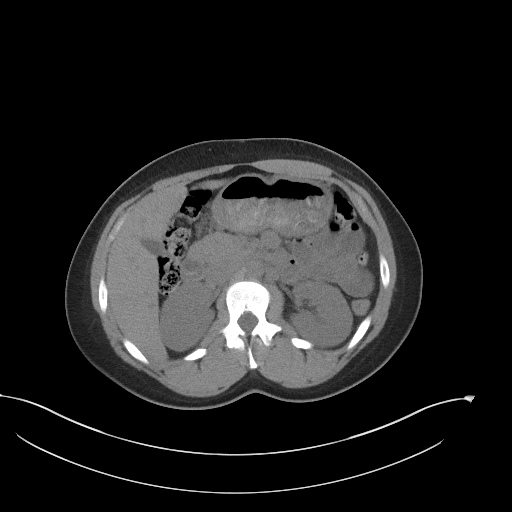
[im 66/90  soft-tissue]
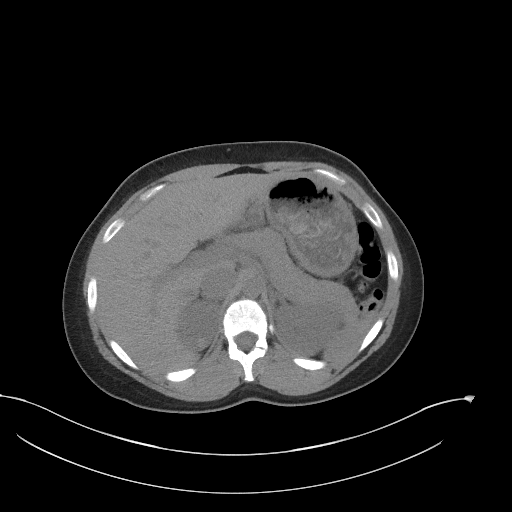
[im 74/90  soft-tissue]
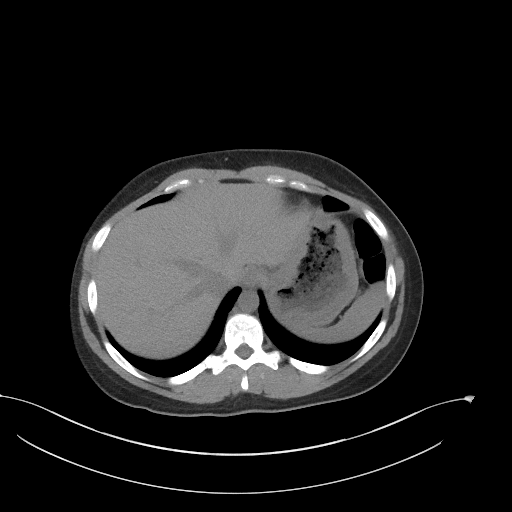
[im 78/90  soft-tissue]
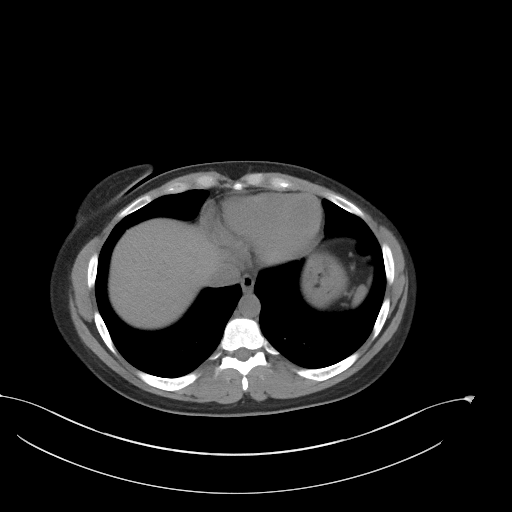
[im 86/90  soft-tissue]
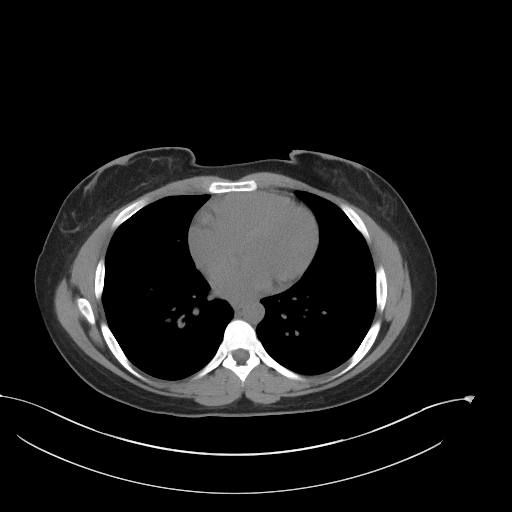

[Series 5: coronal st · coronal · 0.87mm/px · 3 of 76 slices shown]
[im 26/76  soft-tissue]
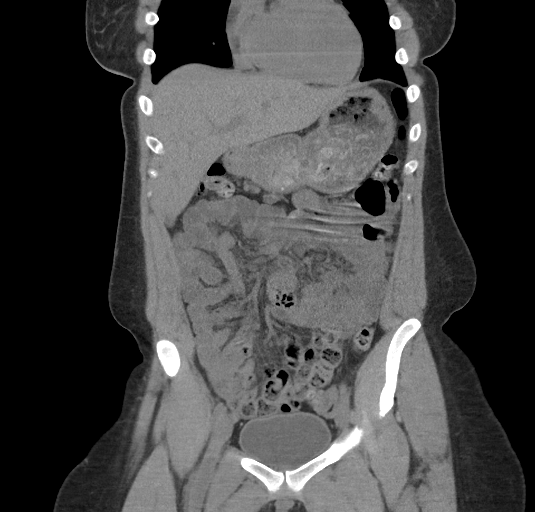
[im 34/76  soft-tissue]
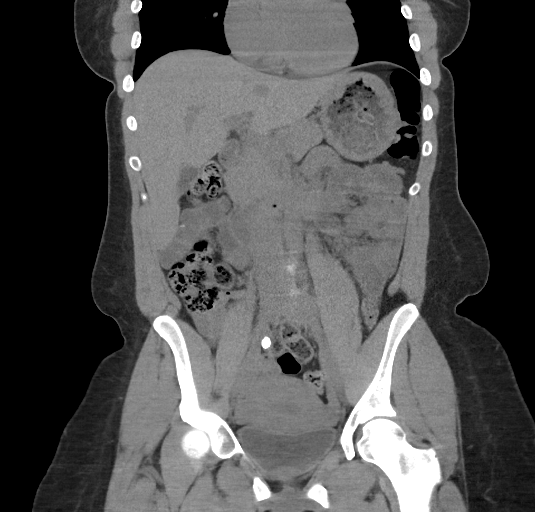
[im 42/76  soft-tissue]
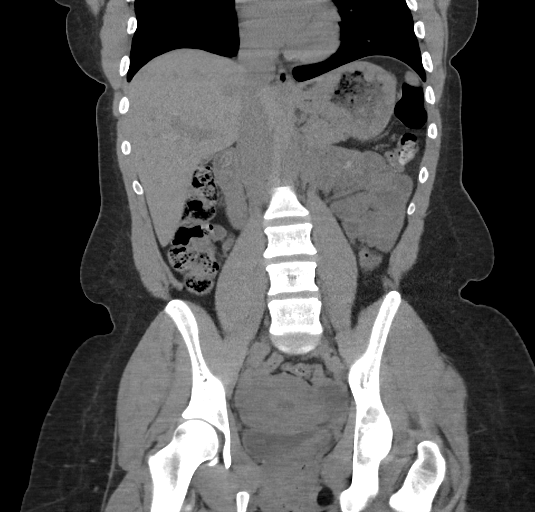

[17 of 46 positions shown; findings below may reference images not displayed]

FINDINGS: Lower chest: Lung bases are clear.

Hepatobiliary: No focal liver abnormality is seen. No gallstones,
gallbladder wall thickening, or biliary dilatation.

Pancreas: Unremarkable. No pancreatic ductal dilatation or
surrounding inflammatory changes.

Spleen: Normal in size without focal abnormality.

Adrenals/Urinary Tract: Adrenal glands are unremarkable. Kidneys are
normal, without renal calculi, focal lesion, or hydronephrosis.
Bladder is unremarkable.

Stomach/Bowel: Stomach is within normal limits. Appendix appears
normal. No evidence of bowel wall thickening, distention, or
inflammatory changes.

Vascular/Lymphatic: No significant vascular findings are present. No
enlarged abdominal or pelvic lymph nodes.

Reproductive: Uterus and bilateral adnexa are unremarkable.

Other: No abdominal wall hernia or abnormality. No abdominopelvic
ascites.

Musculoskeletal: No acute or significant osseous findings.
IMPRESSION: 1. No renal or ureteral stone or obstruction. No acute process
demonstrated in the abdomen or pelvis on noncontrast imaging.

## 2019-01-21 ENCOUNTER — Telehealth: Payer: Self-pay | Admitting: *Deleted

## 2019-01-21 NOTE — Telephone Encounter (Signed)
Pt states that she had surgery on 12/9. She has started having some pain in her breast, not sure if it is related.

## 2019-01-21 NOTE — Telephone Encounter (Signed)
Pt is having pain in both breasts. Breasts feel heavy. Pain around nipple. I advised she needs to schedule an appt. Pt to call back tomorrow after 8:30 to schedule appt. Pt has family planning medicaid so she will ask how much visit will cost when she schedules. JSY

## 2019-01-28 ENCOUNTER — Encounter: Payer: Self-pay | Admitting: Advanced Practice Midwife

## 2019-01-28 ENCOUNTER — Other Ambulatory Visit: Payer: Self-pay

## 2019-01-28 ENCOUNTER — Ambulatory Visit (INDEPENDENT_AMBULATORY_CARE_PROVIDER_SITE_OTHER): Payer: Self-pay | Admitting: Advanced Practice Midwife

## 2019-01-28 VITALS — BP 126/80 | HR 75 | Ht 63.0 in | Wt 212.0 lb

## 2019-01-28 DIAGNOSIS — N644 Mastodynia: Secondary | ICD-10-CM

## 2019-01-28 NOTE — Progress Notes (Signed)
[facility] Visit  Patient name: Kimberly Kline MRN [id number]  Date of birth: 09-30-1985  CC & HPI:  Kimberly Kline is a 34 y.o. African American female presenting today for nipple sensitivity for a week. She had an endo ablation 5 weeks ago.  A week ago, she started having nipple pain that would wake her up at night (sleeps on stomach), very sensitive to touch/shower).  Hasn't bled any since surgery. No itching, redness, flakiness.  Caffeine intake same as always (coffe on occasion).    Pertinent History Reviewed:  Medical & Surgical Hx:   Past Medical History:  Diagnosis Date  . GERD (gastroesophageal reflux disease)    Past Surgical History:  Procedure Laterality Date  . APPENDECTOMY    . DILATATION AND CURETTAGE/HYSTEROSCOPY WITH MINERVA N/A 12/26/2018   Procedure: DILATATION/HYSTEROSCOPY WITH MINERVA ENDOMETRIAL ABLATION;  Surgeon: Lazaro Arms, MD;  Location: [facility];  Service: Gynecology;  Laterality: N/A;  . TUBAL LIGATION  2013   Family History  Problem Relation Age of Onset  . Hypertension Mother   . Diabetes Mother   . ADD / ADHD Son   . Diabetes Maternal Grandmother   . Hypertension Maternal Grandmother   . Stroke Maternal Grandmother   . Diabetes Maternal Grandfather   . Hypertension Maternal Grandfather   . Asthma Son   . Asthma Son    No current outpatient medications on file. Social History: Reviewed -  reports that she quit smoking about 12 years ago. Her smoking use included cigars. She quit after 3.00 years of use. She has never used smokeless tobacco.  Review of Systems:   Negative except for HPI  Objective Findings:    Physical Examination: Vitals:   01/28/19 1435  BP: 126/80  Pulse: 75   General appearance - well appearing, and in no distress Mental status - alert, oriented to person, place, and time Chest:  Normal respiratory effort Breasts:  Nipples appear normal, no erythema, scaling.  Areolar area sensitive Heart - normal  rate and regular rhythm Abdomen:  Soft, nontender  Musculoskeletal:  Normal range of motion without pain Extremities:  No edema    No results found for this or any previous visit (from the past 24 hour(s)).    Assessment & Plan:  A:   Nipple sensitivity. Discussed w/LHE, Possibly r/t menstrual cycle?   Not appearing to be pathogenic   No follow-ups on file.  Jacklyn Shell CNM 01/28/2019 2:54 PM

## 2019-07-16 ENCOUNTER — Ambulatory Visit: Payer: Medicaid Other | Admitting: Obstetrics & Gynecology

## 2019-07-29 DIAGNOSIS — E669 Obesity, unspecified: Secondary | ICD-10-CM | POA: Diagnosis not present

## 2019-07-29 DIAGNOSIS — Z01419 Encounter for gynecological examination (general) (routine) without abnormal findings: Secondary | ICD-10-CM | POA: Diagnosis not present

## 2019-07-29 DIAGNOSIS — Z90710 Acquired absence of both cervix and uterus: Secondary | ICD-10-CM | POA: Diagnosis not present

## 2019-08-06 DIAGNOSIS — D473 Essential (hemorrhagic) thrombocythemia: Secondary | ICD-10-CM | POA: Diagnosis not present

## 2019-10-03 ENCOUNTER — Other Ambulatory Visit: Payer: Medicaid Other

## 2019-10-04 ENCOUNTER — Other Ambulatory Visit: Payer: Self-pay

## 2019-10-04 ENCOUNTER — Other Ambulatory Visit: Payer: Medicaid Other

## 2019-10-04 DIAGNOSIS — Z20822 Contact with and (suspected) exposure to covid-19: Secondary | ICD-10-CM | POA: Diagnosis not present

## 2019-10-07 LAB — NOVEL CORONAVIRUS, NAA: SARS-CoV-2, NAA: NOT DETECTED

## 2020-01-13 DIAGNOSIS — H04123 Dry eye syndrome of bilateral lacrimal glands: Secondary | ICD-10-CM | POA: Diagnosis not present

## 2020-01-26 DIAGNOSIS — G4489 Other headache syndrome: Secondary | ICD-10-CM | POA: Diagnosis not present

## 2020-01-26 DIAGNOSIS — R519 Headache, unspecified: Secondary | ICD-10-CM | POA: Diagnosis not present

## 2020-01-26 DIAGNOSIS — U071 COVID-19: Secondary | ICD-10-CM | POA: Diagnosis not present

## 2020-01-26 DIAGNOSIS — Z87891 Personal history of nicotine dependence: Secondary | ICD-10-CM | POA: Diagnosis not present

## 2020-01-26 DIAGNOSIS — R42 Dizziness and giddiness: Secondary | ICD-10-CM | POA: Diagnosis not present

## 2020-02-06 ENCOUNTER — Other Ambulatory Visit: Payer: Medicaid Other

## 2020-02-06 ENCOUNTER — Other Ambulatory Visit: Payer: Self-pay

## 2020-02-06 DIAGNOSIS — Z20822 Contact with and (suspected) exposure to covid-19: Secondary | ICD-10-CM

## 2020-02-08 LAB — SARS-COV-2, NAA 2 DAY TAT

## 2020-02-08 LAB — NOVEL CORONAVIRUS, NAA: SARS-CoV-2, NAA: NOT DETECTED

## 2020-03-12 ENCOUNTER — Ambulatory Visit (INDEPENDENT_AMBULATORY_CARE_PROVIDER_SITE_OTHER): Payer: Self-pay | Admitting: Adult Health

## 2020-03-12 ENCOUNTER — Encounter: Payer: Self-pay | Admitting: Adult Health

## 2020-03-12 ENCOUNTER — Other Ambulatory Visit: Payer: Self-pay

## 2020-03-12 VITALS — BP 126/86 | HR 66 | Ht 63.0 in | Wt 212.0 lb

## 2020-03-12 DIAGNOSIS — N644 Mastodynia: Secondary | ICD-10-CM | POA: Insufficient documentation

## 2020-03-12 DIAGNOSIS — N6314 Unspecified lump in the right breast, lower inner quadrant: Secondary | ICD-10-CM | POA: Insufficient documentation

## 2020-03-12 NOTE — Progress Notes (Signed)
  Subjective:     Patient ID: Kimberly Kline, female   DOB: 1985-02-24, 35 y.o.   MRN: [id number]  HPI Kimberly Kline is a 35 year old black female, married, G3P3, in complaining of bilateral nipple pain, can wake her up at night. Had episode 01/2019. She has decreased caffeine and started taking vitamin E. She works at [employer].  She is sp ablation but still has really lite periods, LMP 03/01/20 for 2 days.   Review of Systems +bialteral nipple pain for a about a month Denies any nipple discharge, itching or lumps  Reviewed past medical,surgical, social and family history. Reviewed medications and allergies.     Objective:   Physical Exam BP 126/86 (BP Location: Left Arm, Patient Position: Sitting, Cuff Size: Normal)   Pulse 66   Ht 5\' 3"  (1.6 m)   Wt 212 lb (96.2 kg)   LMP 03/01/2020 (Approximate)   BMI 37.55 kg/m   Skin warm and dry,  Breasts:no dominate palpable mass, retraction or nipple discharge on the left, on the right no retraction or nipple discharge, has oval mass at 3 0' clock at edge of areola, she says both nipples tender Examination chaperoned by 03/03/2020 LPN Fall risk is low  Upstream - 03/12/20 0906      Pregnancy Intention Screening   Does the patient want to become pregnant in the next year? No    Does the patient's partner want to become pregnant in the next year? No    Would the patient like to discuss contraceptive options today? No      Contraception Wrap Up   Current Method Female Sterilization    End Method Female Sterilization    Contraception Counseling Provided No             Assessment:     1. Nipple pain Scheduled diagnostic mammogram and right and left 0907 at [facility] for 03/31/20 at 2:40 pm  2. Mass of lower inner quadrant of right breast Will get diagnotic mammogram and 04/02/20    Plan:       Follow up prn

## 2020-03-31 ENCOUNTER — Ambulatory Visit (HOSPITAL_COMMUNITY)
Admission: RE | Admit: 2020-03-31 | Discharge: 2020-03-31 | Disposition: A | Discharge status: Home / Self Care | Payer: BC Managed Care – PPO | Source: Ambulatory Visit | Attending: Adult Health | Admitting: Adult Health

## 2020-03-31 ENCOUNTER — Other Ambulatory Visit: Payer: Self-pay

## 2020-03-31 DIAGNOSIS — N644 Mastodynia: Secondary | ICD-10-CM | POA: Diagnosis not present

## 2020-03-31 DIAGNOSIS — N6314 Unspecified lump in the right breast, lower inner quadrant: Secondary | ICD-10-CM

## 2020-03-31 DIAGNOSIS — R928 Other abnormal and inconclusive findings on diagnostic imaging of breast: Secondary | ICD-10-CM | POA: Diagnosis not present

## 2020-05-22 ENCOUNTER — Other Ambulatory Visit: Payer: Self-pay

## 2020-05-22 ENCOUNTER — Ambulatory Visit
Admission: EM | Admit: 2020-05-22 | Discharge: 2020-05-22 | Disposition: A | Discharge status: Home / Self Care | Payer: BC Managed Care – PPO | Attending: Emergency Medicine | Admitting: Emergency Medicine

## 2020-05-22 DIAGNOSIS — J020 Streptococcal pharyngitis: Secondary | ICD-10-CM

## 2020-05-22 LAB — POCT RAPID STREP A (OFFICE): Rapid Strep A Screen: POSITIVE — AB

## 2020-05-22 MED ORDER — IBUPROFEN 600 MG PO TABS: 600.0000 mg | ORAL_TABLET | Freq: Four times a day (QID) | ORAL | 0 refills | Status: DC | PRN

## 2020-05-22 MED ORDER — AMOXICILLIN 500 MG PO TABS: 500.0000 mg | ORAL_TABLET | Freq: Two times a day (BID) | ORAL | 0 refills | Status: AC

## 2020-05-22 NOTE — ED Provider Notes (Signed)
HPI  SUBJECTIVE:  Patient reports sore throat, burning sinus pain and pressure starting 2 days ago after a significant exposure to pollen. Sx worse with swallowing.  Sx better with warm compresses.  Has been taking Benadryl w/ o relief.  No fevers + Swollen neck glands   No neck stiffness  + Cough productive of greenish mucus No nasal congestion, rhinorrhea No Myalgias No Headache No Rash No facial swelling, upper dental pain  No loss of taste or smell No shortness of breath or difficulty breathing No nausea, vomiting No diarrhea No abdominal pain     No Recent Strep, mono, COVID exposure.  His daughter has similar symptoms of sore throat/headache, but tested negative for COVID 3 days ago No reflux sxs No Allergy sxs  No Breathing difficulty, sensation of throat swelling shut + Voice changes-reports muffled voice No Drooling No Trismus No abx in past month.   No antipyretic in past 4-6 hrs She has a past medical history of GERD which is not bothering her.  No history of allergies, frequent strep, mono, diabetes, hypertension. LMP: 3 days ago.  Denies possibility being pregnant. PMD: None   Past Medical History:  Diagnosis Date  . GERD (gastroesophageal reflux disease)     Past Surgical History:  Procedure Laterality Date  . APPENDECTOMY    . DILATATION AND CURETTAGE/HYSTEROSCOPY WITH MINERVA N/A 12/26/2018   Procedure: DILATATION/HYSTEROSCOPY WITH MINERVA ENDOMETRIAL ABLATION;  Surgeon: Lazaro Arms, MD;  Location: [facility];  Service: Gynecology;  Laterality: N/A;  . TUBAL LIGATION  2013    Family History  Problem Relation Age of Onset  . Hypertension Mother   . Diabetes Mother   . ADD / ADHD Son   . Diabetes Maternal Grandmother   . Hypertension Maternal Grandmother   . Stroke Maternal Grandmother   . Diabetes Maternal Grandfather   . Hypertension Maternal Grandfather   . Asthma Son   . Asthma Son     Social History   Tobacco Use  . Smoking status:  Former Smoker    Years: 3.00    Types: Cigars    Quit date: 12/24/2006    Years since quitting: 13.4  . Smokeless tobacco: Never Used  . Tobacco comment: 2x week   Vaping Use  . Vaping Use: Never used  Substance Use Topics  . Alcohol use: Yes    Comment: Occassionally  . Drug use: No    No current facility-administered medications for this encounter.  Current Outpatient Medications:  .  amoxicillin (AMOXIL) 500 MG tablet, Take 1 tablet (500 mg total) by mouth 2 (two) times daily for 10 days., Disp: 20 tablet, Rfl: 0 .  ibuprofen (ADVIL) 600 MG tablet, Take 1 tablet (600 mg total) by mouth every 6 (six) hours as needed., Disp: 30 tablet, Rfl: 0 .  vitamin E 1000 UNIT capsule, Take 1,000 Units by mouth daily., Disp: , Rfl:   No Known Allergies   ROS  As noted in HPI.   Physical Exam  BP (!) 139/91   Pulse 81   Temp 100.1 F (37.8 C)   Resp 18   SpO2 97%   Constitutional: Well developed, well nourished, no acute distress Eyes:  EOMI, conjunctiva normal bilaterally HENT: Normocephalic, atraumatic,mucus membranes moist. - nasal congestion.  positive maxillary sinus tenderness.  No frontal sinus tenderness.  Erythematous, swollen turbinates.  + slightly erythematous oropharynx - enlarged tonsils - exudates. Uvula midline.  No drooling, trismus.  Slightly hoarse voice. Respiratory: Normal inspiratory effort Cardiovascular:  Normal rate, no murmurs, rubs, gallops GI: nondistended, nontender. No appreciable splenomegaly skin: No rash, skin intact Lymph:  + Anterior cervical LN.  No posterior cervical lymphadenopathy Musculoskeletal: no deformities Neurologic: Alert & oriented x 3, no focal neuro deficits Psychiatric: Speech and behavior appropriate  ED Course   Medications - No data to display  Orders Placed This Encounter  Procedures  . POCT rapid strep A    Standing Status:   Standing    Number of Occurrences:   1    Results for orders placed or performed during  the hospital encounter of 05/22/20 (from the past 24 hour(s))  POCT rapid strep A     Status: Abnormal   Collection Time: 05/22/20  4:50 PM  Result Value Ref Range   Rapid Strep A Screen Positive (A) Negative   No results found.  ED Clinical Impression  1. Strep pharyngitis      ED Assessment/Plan  Rapid strep positive. Sending home with amoxicillin for 10 days. Home with ibuprofen, Tylenol, Benadryl/Maalox mixture.  Advised patient to try some saline nasal irrigation, Mucinex for the sinus pain and pressure.  Patient to followup with PMD of choice when necessary, will refer to local primary care resources.  Discussed labs,  MDM, plan and followup with patient. Discussed sn/sx that should prompt return to the ED. patient agrees with plan.   Meds ordered this encounter  Medications  . ibuprofen (ADVIL) 600 MG tablet    Sig: Take 1 tablet (600 mg total) by mouth every 6 (six) hours as needed.    Dispense:  30 tablet    Refill:  0  . amoxicillin (AMOXIL) 500 MG tablet    Sig: Take 1 tablet (500 mg total) by mouth 2 (two) times daily for 10 days.    Dispense:  20 tablet    Refill:  0     *This clinic note was created using Scientist, clinical (histocompatibility and immunogenetics). Therefore, there may be occasional mistakes despite careful proofreading.     Domenick Gong, MD 05/23/20 [phone]

## 2020-05-22 NOTE — Discharge Instructions (Addendum)
Your rapid strep was positive today so I am treating you with amoxicillin.  Finish the antibiotics, even if you feel better.  You can try some Mucinex and some saline nasal irrigation with a NeilMed sinus rinse as often as you want for the sinus pain and pressure.  1000 mg of Tylenol and 600 mg ibuprofen together 3-4 times a day as needed for pain.  Make sure you drink plenty of extra fluids.  Some people find salt water gargles and  Traditional Medicinal's "Throat Coat" tea helpful. Take 5 mL of liquid Benadryl and 5 mL of Maalox. Mix it together, and then hold it in your mouth for as long as you can and then swallow. You may do this 4 times a day.    Go to www.goodrx.com  or www.costplusdrugs.com to look up your medications. This will give you a list of where you can find your prescriptions at the most affordable prices. Or ask the pharmacist what the cash price is, or if they have any other discount programs available to help make your medication more affordable. This can be less expensive than what you would pay with insurance.

## 2020-05-22 NOTE — ED Triage Notes (Signed)
Pt presents with c/o sore throat for past couple of days  

## 2020-05-28 ENCOUNTER — Encounter (HOSPITAL_COMMUNITY): Payer: Self-pay

## 2020-06-05 ENCOUNTER — Ambulatory Visit: Payer: BC Managed Care – PPO | Admitting: Nurse Practitioner

## 2020-06-10 DIAGNOSIS — Z87891 Personal history of nicotine dependence: Secondary | ICD-10-CM | POA: Diagnosis not present

## 2020-06-10 DIAGNOSIS — R350 Frequency of micturition: Secondary | ICD-10-CM | POA: Diagnosis not present

## 2020-06-10 DIAGNOSIS — N760 Acute vaginitis: Secondary | ICD-10-CM | POA: Diagnosis not present

## 2020-06-10 DIAGNOSIS — R300 Dysuria: Secondary | ICD-10-CM | POA: Diagnosis not present

## 2020-06-30 ENCOUNTER — Ambulatory Visit: Payer: No Typology Code available for payment source | Admitting: Nurse Practitioner

## 2020-06-30 ENCOUNTER — Other Ambulatory Visit: Payer: Self-pay

## 2020-06-30 ENCOUNTER — Encounter: Payer: Self-pay | Admitting: Nurse Practitioner

## 2020-06-30 VITALS — BP 130/86 | HR 71 | Temp 100.1°F | Resp 20 | Ht 63.0 in | Wt 207.0 lb

## 2020-06-30 DIAGNOSIS — N6314 Unspecified lump in the right breast, lower inner quadrant: Secondary | ICD-10-CM

## 2020-06-30 DIAGNOSIS — Z139 Encounter for screening, unspecified: Secondary | ICD-10-CM | POA: Diagnosis not present

## 2020-06-30 DIAGNOSIS — Z7689 Persons encountering health services in other specified circumstances: Secondary | ICD-10-CM | POA: Diagnosis not present

## 2020-06-30 DIAGNOSIS — E669 Obesity, unspecified: Secondary | ICD-10-CM | POA: Diagnosis not present

## 2020-06-30 DIAGNOSIS — Z0001 Encounter for general adult medical examination with abnormal findings: Secondary | ICD-10-CM | POA: Insufficient documentation

## 2020-06-30 NOTE — Assessment & Plan Note (Signed)
-  will screen for HCV and HIV with next set of labs 

## 2020-06-30 NOTE — Progress Notes (Signed)
New Patient Office Visit  Subjective:  Patient ID: Kimberly Kline, female    DOB: 11/28/1985  Age: 35 y.o. MRN: [id number]  CC:  Chief Complaint  Patient presents with   New Patient (Initial Visit)    HPI Kimberly Kline presents for new patient visit. Transferring care from [facility] Morales. No recent physical or labs.  She just wants to get set up with a PCP. No acute concerns.    Past Medical History:  Diagnosis Date   GERD (gastroesophageal reflux disease)     Past Surgical History:  Procedure Laterality Date   APPENDECTOMY     DILATATION AND CURETTAGE/HYSTEROSCOPY WITH MINERVA N/A 12/26/2018   Procedure: DILATATION/HYSTEROSCOPY WITH MINERVA ENDOMETRIAL ABLATION;  Surgeon: Florian Buff, MD;  Location: [facility];  Service: Gynecology;  Laterality: N/A;   TUBAL LIGATION  2013    Family History  Problem Relation Age of Onset   Hypertension Mother    Diabetes Mother    ADD / ADHD Son    Diabetes Maternal Grandmother    Hypertension Maternal Grandmother    Stroke Maternal Grandmother    Diabetes Maternal Grandfather    Hypertension Maternal Grandfather    Asthma Son    Asthma Son     Social History   Socioeconomic History   Marital status: Married    Spouse name: Not on file   Number of children: 3   Years of education: 20   Highest education level: Not on file  Occupational History   Occupation: assembly    Employer: [employer]   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  Tobacco Use   Smoking status: Former    Pack years: 0.00    Types: Cigars    Quit date: 12/24/2006    Years since quitting: 13.5   Smokeless tobacco: Never   Tobacco comments:    2x week   Vaping Use   Vaping Use: Never used  Substance and Sexual Activity   Alcohol use: Yes    Alcohol/week: 1.0 standard drink    Types: 1 Standard drinks or equivalent per week    Comment: occasionally   Drug use: No   Sexual activity: Yes    Birth  control/protection: Surgical    Comment: tubal and ablation  Other Topics Concern   Not on file  Social History Narrative   Not on file   Social Determinants of Health   Financial Resource Strain: Not on file  Food Insecurity: Not on file  Transportation Needs: Not on file  Physical Activity: Not on file  Stress: Not on file  Social Connections: Not on file  Intimate Partner Violence: Not on file    ROS Review of Systems  Constitutional: Negative.   Respiratory: Negative.    Cardiovascular: Negative.   Musculoskeletal: Negative.   Psychiatric/Behavioral: Negative.     Objective:   Today's Vitals: BP 130/86 (BP Location: Right Arm, Patient Position: Sitting, Cuff Size: Large)   Pulse 71   Temp 100.1 F (37.8 C) (Oral)   Resp 20   Ht _0  (1.6 m)   Wt 207 lb (93.9 kg)   SpO2 98%   BMI 36.67 kg/m   Physical Exam Constitutional:      Appearance: Normal appearance.  Cardiovascular:     Rate and Rhythm: Normal rate and regular rhythm.     Pulses: Normal pulses.     Heart sounds: Normal heart sounds.  Pulmonary:  Effort: Pulmonary effort is normal.     Breath sounds: Normal breath sounds.  Musculoskeletal:        General: Normal range of motion.  Neurological:     Mental Status: She is alert.  Psychiatric:        Mood and Affect: Mood normal.        Behavior: Behavior normal.        Thought Content: Thought content normal.        Judgment: Judgment normal.    Assessment & Plan:   Problem List Items Addressed This Visit       Other   Mass of lower inner quadrant of right breast    -no issues today       Encounter to establish care - Primary    -obtain records       Relevant Orders   CBC with Differential/Platelet   CMP14+EGFR   Lipid Panel With LDL/HDL Ratio   Screening due    -will screen for HCV and HIV with next set of labs       Relevant Orders   Hepatitis C antibody   HIV Antibody (routine testing w rflx)   Obesity (BMI 30-39.9)     -will monitor weight -may discuss medical treatment options if blood work looks good        Outpatient Encounter Medications as of 06/30/2020  Medication Sig   [DISCONTINUED] ibuprofen (ADVIL) 600 MG tablet Take 1 tablet (600 mg total) by mouth every 6 (six) hours as needed.   [DISCONTINUED] vitamin E 1000 UNIT capsule Take 1,000 Units by mouth daily.   No facility-administered encounter medications on file as of 06/30/2020.    Follow-up: No follow-ups on file.   Noreene Larsson, NP

## 2020-06-30 NOTE — Assessment & Plan Note (Signed)
-  no issues today 

## 2020-06-30 NOTE — Assessment & Plan Note (Addendum)
-  will monitor weight -may discuss medical treatment options if blood work looks good

## 2020-06-30 NOTE — Assessment & Plan Note (Signed)
-  obtain records 

## 2020-06-30 NOTE — Patient Instructions (Signed)
Please have fasting labs drawn 2-3 days prior to your appointment so we can discuss the results during your office visit.  

## 2020-07-02 ENCOUNTER — Other Ambulatory Visit: Payer: Self-pay

## 2020-07-02 ENCOUNTER — Ambulatory Visit
Admission: RE | Admit: 2020-07-02 | Discharge: 2020-07-02 | Disposition: A | Discharge status: Home / Self Care | Payer: No Typology Code available for payment source | Source: Ambulatory Visit | Attending: Emergency Medicine | Admitting: Emergency Medicine

## 2020-07-02 VITALS — BP 136/87 | HR 65 | Temp 97.5°F | Resp 18

## 2020-07-02 DIAGNOSIS — R350 Frequency of micturition: Secondary | ICD-10-CM | POA: Diagnosis not present

## 2020-07-02 LAB — POCT URINALYSIS DIP (MANUAL ENTRY)
Bilirubin, UA: NEGATIVE
Glucose, UA: NEGATIVE mg/dL
Ketones, POC UA: NEGATIVE mg/dL
Leukocytes, UA: NEGATIVE
Nitrite, UA: NEGATIVE
Protein Ur, POC: NEGATIVE mg/dL
Spec Grav, UA: 1.02 (ref 1.010–1.025)
Urobilinogen, UA: 0.2 E.U./dL
pH, UA: 6.5 (ref 5.0–8.0)

## 2020-07-02 MED ORDER — NITROFURANTOIN MONOHYD MACRO 100 MG PO CAPS: 100.0000 mg | ORAL_CAPSULE | Freq: Two times a day (BID) | ORAL | 0 refills | Status: DC

## 2020-07-02 NOTE — ED Triage Notes (Signed)
Lower abd pressure, urinary frequency and painful when she wipes x 3 days.

## 2020-07-02 NOTE — ED Provider Notes (Signed)
[facility]   CC: Burning with urination  SUBJECTIVE:  Kimberly Kline is a 35 y.o. female who complains of lower abdominal pressure, urinary frequency, and painful with wiping x 3 days.  Admits to recent sexual activity.  Denies alleviating factors.  Symptoms are made worse with urination.  Admits to similar symptoms in the past with yeast infection.  Denies fever, chills, nausea, vomiting, abdominal pain, flank pain.    LMP: Patient's last menstrual period was 06/15/2020.  ROS: As in HPI.  All other pertinent ROS negative.     Past Medical History:  Diagnosis Date   GERD (gastroesophageal reflux disease)    Past Surgical History:  Procedure Laterality Date   APPENDECTOMY     DILATATION AND CURETTAGE/HYSTEROSCOPY WITH MINERVA N/A 12/26/2018   Procedure: DILATATION/HYSTEROSCOPY WITH MINERVA ENDOMETRIAL ABLATION;  Surgeon: Lazaro Arms, MD;  Location: [facility];  Service: Gynecology;  Laterality: N/A;   TUBAL LIGATION  2013   No Known Allergies No current facility-administered medications on file prior to encounter.   No current outpatient medications on file prior to encounter.   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, forensic  Tobacco Use   Smoking status: Former    Pack years: 0.00    Types: Cigars    Quit date: 12/24/2006    Years since quitting: 13.5   Smokeless tobacco: Never   Tobacco comments:    2x week   Vaping Use   Vaping Use: Never used  Substance and Sexual Activity   Alcohol use: Yes    Alcohol/week: 1.0 standard drink    Types: 1 Standard drinks or equivalent per week    Comment: occasionally   Drug use: No   Sexual activity: Yes    Birth  control/protection: Surgical    Comment: tubal and ablation  Other Topics Concern   Not on file  Social History Narrative   Not on file   Social Determinants of Health   Financial Resource Strain: Not on file  Food Insecurity: Not on file  Transportation Needs: Not on file  Physical Activity: Not on file  Stress: Not on file  Social Connections: Not on file  Intimate Partner Violence: Not on file   Family History  Problem Relation Age of Onset   Hypertension Mother    Diabetes Mother    ADD / ADHD Son    Diabetes Maternal Grandmother    Hypertension Maternal Grandmother    Stroke Maternal Grandmother    Diabetes Maternal Grandfather    Hypertension Maternal Grandfather    Asthma Son    Asthma Son     OBJECTIVE:  Vitals:   07/02/20 1818  BP: 136/87  Pulse: 65  Resp: 18  Temp: (!) 97.5 F (36.4 C)  TempSrc: Tympanic  SpO2: 97%   General appearance: AOx3 in no acute distress HEENT: NCAT.  Oropharynx clear.  Lungs: clear to auscultation bilaterally without adventitious breath sounds Heart: regular rate and rhythm.   Abdomen: soft; non-distended; no tenderness; bowel sounds present; no guarding Back: no CVA tenderness Extremities: no edema; symmetrical with no gross deformities Skin: warm and dry Neurologic: Ambulates from chair to exam  table without difficulty Psychological: alert and cooperative; normal mood and affect  Labs Reviewed  POCT URINALYSIS DIP (MANUAL ENTRY) - Abnormal; Notable for the following components:      Result Value   Blood, UA trace-intact (*)    All other components within normal limits    ASSESSMENT & PLAN:  1. Urinary frequency     Meds ordered this encounter  Medications   nitrofurantoin, macrocrystal-monohydrate, (MACROBID) 100 MG capsule    Sig: Take 1 capsule (100 mg total) by mouth 2 (two) times daily.    Dispense:  10 capsule    Refill:  0    Order Specific Question:   Supervising Provider    Answer:   Eustace Moore [id number]   Urine with trace blood.  This may a sign of UTI Urine culture sent.  We will call you with the results.   Push fluids and get plenty of rest.   Take antibiotic as directed and to completion Follow up with PCP if symptoms persists Return here or go to ER if you have any new or worsening symptoms such as fever, worsening abdominal pain, nausea/vomiting, flank pain, etc...  Outlined signs and symptoms indicating need for more acute intervention. Patient verbalized understanding. After Visit Summary given.      Rennis Harding, PA-C 07/02/20 1949

## 2020-07-02 NOTE — Discharge Instructions (Addendum)
Urine with trace blood.  This may a sign of UTI Urine culture sent.  We will call you with the results.   Push fluids and get plenty of rest.   Take antibiotic as directed and to completion Follow up with PCP if symptoms persists Return here or go to ER if you have any new or worsening symptoms such as fever, worsening abdominal pain, nausea/vomiting, flank pain, etc..Marland Kitchen

## 2020-07-14 NOTE — Progress Notes (Signed)
Labs are great

## 2020-07-15 ENCOUNTER — Encounter: Payer: Self-pay | Admitting: Nurse Practitioner

## 2020-07-15 ENCOUNTER — Other Ambulatory Visit: Payer: Self-pay

## 2020-07-15 ENCOUNTER — Ambulatory Visit (INDEPENDENT_AMBULATORY_CARE_PROVIDER_SITE_OTHER): Payer: No Typology Code available for payment source | Admitting: Nurse Practitioner

## 2020-07-15 DIAGNOSIS — E7800 Pure hypercholesterolemia, unspecified: Secondary | ICD-10-CM | POA: Diagnosis not present

## 2020-07-15 DIAGNOSIS — Z0001 Encounter for general adult medical examination with abnormal findings: Secondary | ICD-10-CM | POA: Diagnosis not present

## 2020-07-15 DIAGNOSIS — E785 Hyperlipidemia, unspecified: Secondary | ICD-10-CM | POA: Insufficient documentation

## 2020-07-15 LAB — CMP14+EGFR
ALT: 15 IU/L (ref 0–32)
AST: 14 IU/L (ref 0–40)
Albumin/Globulin Ratio: 1.3 (ref 1.2–2.2)
Albumin: 4.2 g/dL (ref 3.8–4.8)
Alkaline Phosphatase: 44 IU/L (ref 44–121)
BUN/Creatinine Ratio: 11 (ref 9–23)
BUN: 9 mg/dL (ref 6–20)
Bilirubin Total: 0.4 mg/dL (ref 0.0–1.2)
CO2: 22 mmol/L (ref 20–29)
Calcium: 9.1 mg/dL (ref 8.7–10.2)
Chloride: 101 mmol/L (ref 96–106)
Creatinine, Ser: 0.83 mg/dL (ref 0.57–1.00)
Globulin, Total: 3.2 g/dL (ref 1.5–4.5)
Glucose: 80 mg/dL (ref 65–99)
Potassium: 4.1 mmol/L (ref 3.5–5.2)
Sodium: 136 mmol/L (ref 134–144)
Total Protein: 7.4 g/dL (ref 6.0–8.5)
eGFR: 96 mL/min/{1.73_m2} (ref >59)

## 2020-07-15 LAB — CBC WITH DIFFERENTIAL/PLATELET
Basophils Absolute: 0 10*3/uL (ref 0.0–0.2)
Basos: 1 %
EOS (ABSOLUTE): 0.1 10*3/uL (ref 0.0–0.4)
Eos: 1 %
Hematocrit: 36.6 % (ref 34.0–46.6)
Hemoglobin: 12.3 g/dL (ref 11.1–15.9)
Immature Grans (Abs): 0 10*3/uL (ref 0.0–0.1)
Immature Granulocytes: 0 %
Lymphocytes Absolute: 1.8 10*3/uL (ref 0.7–3.1)
Lymphs: 28 %
MCH: 31 pg (ref 26.6–33.0)
MCHC: 33.6 g/dL (ref 31.5–35.7)
MCV: 92 fL (ref 79–97)
Monocytes Absolute: 0.4 10*3/uL (ref 0.1–0.9)
Monocytes: 6 %
Neutrophils Absolute: 4.1 10*3/uL (ref 1.4–7.0)
Neutrophils: 64 %
Platelets: 275 10*3/uL (ref 150–450)
RBC: 3.97 x10E6/uL (ref 3.77–5.28)
RDW: 12.1 % (ref 11.7–15.4)
WBC: 6.3 10*3/uL (ref 3.4–10.8)

## 2020-07-15 LAB — HEPATITIS C ANTIBODY: Hep C Virus Ab: <0.1 s/co ratio (ref 0.0–0.9)

## 2020-07-15 LAB — LIPID PANEL WITH LDL/HDL RATIO
Cholesterol, Total: 175 mg/dL (ref 100–199)
HDL: 46 mg/dL (ref >39)
LDL Chol Calc (NIH): 115 mg/dL — ABNORMAL HIGH (ref 0–99)
LDL/HDL Ratio: 2.5 ratio (ref 0.0–3.2)
Triglycerides: 72 mg/dL (ref 0–149)
VLDL Cholesterol Cal: 14 mg/dL (ref 5–40)

## 2020-07-15 LAB — HIV ANTIBODY (ROUTINE TESTING W REFLEX): HIV Screen 4th Generation wRfx: NONREACTIVE

## 2020-07-15 NOTE — Progress Notes (Signed)
Established Patient Office Visit  Subjective:  Patient ID: Kimberly Kline, female    DOB: 10-07-1985  Age: 35 y.o. MRN: [id number]  CC:  Chief Complaint  Patient presents with   Annual Exam    Annual exam no issues     HPI Kimberly Kline presents for physical exam.  She is followed by [facility] for PAPs/GYN needs.    Past Medical History:  Diagnosis Date   Acute appendicitis with localized peritonitis, without perforation or gangrene 11/28/2017   Last Assessment & Plan:  Formatting of this note might be different from the original. Patient is doing well status post laparoscopic appendectomy (11/11/17). No post operative complications apparent. They will resume regular activities at this time   GERD (gastroesophageal reflux disease)     Past Surgical History:  Procedure Laterality Date   APPENDECTOMY     DILATATION AND CURETTAGE/HYSTEROSCOPY WITH MINERVA N/A 12/26/2018   Procedure: DILATATION/HYSTEROSCOPY WITH MINERVA ENDOMETRIAL ABLATION;  Surgeon: Lazaro Arms, MD;  Location: [facility];  Service: Gynecology;  Laterality: N/A;   TUBAL LIGATION  2013    Family History  Problem Relation Age of Onset   Hypertension Mother    Diabetes Mother    ADD / ADHD Son    Diabetes Maternal Grandmother    Hypertension Maternal Grandmother    Stroke Maternal Grandmother    Diabetes Maternal Grandfather    Hypertension Maternal Grandfather    Asthma Son    Asthma Son     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, forensic  Tobacco Use   Smoking status: Former    Pack years: 0.00    Types: Cigars    Quit date: 12/24/2006    Years since quitting: 13.5   Smokeless tobacco: Never    Tobacco comments:    2x week   Vaping Use   Vaping Use: Never used  Substance and Sexual Activity   Alcohol use: Yes    Alcohol/week: 1.0 standard drink    Types: 1 Standard drinks or equivalent per week    Comment: occasionally   Drug use: No   Sexual activity: Yes    Birth control/protection: Surgical    Comment: tubal and ablation  Other Topics Concern   Not on file  Social History Narrative   Not on file   Social Determinants of Health   Financial Resource Strain: Not on file  Food Insecurity: Not on file  Transportation Needs: Not on file  Physical Activity: Not on file  Stress: Not on file  Social Connections: Not on file  Intimate Partner Violence: Not on file    Outpatient Medications Prior to Visit  Medication Sig Dispense Refill   nitrofurantoin, macrocrystal-monohydrate, (MACROBID) 100 MG capsule Take 1 capsule (100 mg total) by mouth 2 (two) times daily. (Patient not taking: Reported on 07/15/2020) 10 capsule 0   No facility-administered medications prior to visit.    No Known Allergies  ROS Review of Systems  Constitutional: Negative.   HENT: Negative.    Eyes: Negative.   Respiratory: Negative.    Cardiovascular: Negative.   Gastrointestinal: Negative.   Endocrine: Negative.   Genitourinary: Negative.  Musculoskeletal: Negative.   Skin: Negative.   Allergic/Immunologic: Negative.   Neurological: Negative.   Hematological: Negative.   Psychiatric/Behavioral: Negative.       Objective:    Physical Exam Constitutional:      Appearance: Normal appearance.  HENT:     Head: Normocephalic and atraumatic.     Right Ear: Tympanic membrane, ear canal and external ear normal.     Left Ear: Tympanic membrane, ear canal and external ear normal.     Nose: Nose normal.     Mouth/Throat:     Mouth: Mucous membranes are moist.     Pharynx: Oropharynx is clear.  Eyes:     Extraocular Movements: Extraocular movements intact.     Conjunctiva/sclera:  Conjunctivae normal.     Pupils: Pupils are equal, round, and reactive to light.  Cardiovascular:     Rate and Rhythm: Normal rate and regular rhythm.     Pulses: Normal pulses.     Heart sounds: Normal heart sounds.  Pulmonary:     Effort: Pulmonary effort is normal.     Breath sounds: Normal breath sounds.  Abdominal:     General: Abdomen is flat. Bowel sounds are normal.     Palpations: Abdomen is soft.  Genitourinary:    General: Normal vulva.     Rectum: Normal.  Musculoskeletal:        General: Normal range of motion.     Cervical back: Normal range of motion and neck supple.  Skin:    General: Skin is warm and dry.     Capillary Refill: Capillary refill takes less than 2 seconds.  Neurological:     General: No focal deficit present.     Mental Status: She is alert and oriented to person, place, and time.     Cranial Nerves: No cranial nerve deficit.     Sensory: No sensory deficit.     Motor: No weakness.     Coordination: Coordination normal.     Gait: Gait normal.  Psychiatric:        Mood and Affect: Mood normal.        Behavior: Behavior normal.        Thought Content: Thought content normal.        Judgment: Judgment normal.    BP 131/84 (BP Location: Right Arm, Patient Position: Sitting, Cuff Size: Normal)   Pulse 83   Temp 99 F (37.2 C) (Oral)   Resp 18   Ht $R'5\' 3"'Lz$  (1.6 m)   Wt 209 lb 1.9 oz (94.9 kg)   LMP 06/15/2020   SpO2 97%   BMI 37.04 kg/m  Wt Readings from Last 3 Encounters:  07/15/20 209 lb 1.9 oz (94.9 kg)  06/30/20 207 lb (93.9 kg)  03/12/20 212 lb (96.2 kg)     Health Maintenance Due  Topic Date Due   TETANUS/TDAP  Never done    There are no preventive care reminders to display for this patient.  No results found for: TSH Lab Results  Component Value Date   WBC 6.3 07/13/2020   HGB 12.3 07/13/2020   HCT 36.6 07/13/2020   MCV 92 07/13/2020   PLT 275 07/13/2020   Lab Results  Component Value Date   NA 136 07/13/2020   K  4.1 07/13/2020   CO2 22 07/13/2020   GLUCOSE 80 07/13/2020   BUN 9 07/13/2020   CREATININE 0.83 07/13/2020   BILITOT 0.4 07/13/2020   ALKPHOS 44 07/13/2020   AST 14 07/13/2020  ALT 15 07/13/2020   PROT 7.4 07/13/2020   ALBUMIN 4.2 07/13/2020   CALCIUM 9.1 07/13/2020   ANIONGAP 10 12/24/2018   EGFR 96 07/13/2020   Lab Results  Component Value Date   CHOL 175 07/13/2020   Lab Results  Component Value Date   HDL 46 07/13/2020   Lab Results  Component Value Date   LDLCALC 115 (H) 07/13/2020   Lab Results  Component Value Date   TRIG 72 07/13/2020   No results found for: CHOLHDL No results found for: HGBA1C    Assessment & Plan:   Problem List Items Addressed This Visit       Other   Encounter for general adult medical examination with abnormal findings    -physical exam unremarkable -labs show HLD       HLD (hyperlipidemia)    Lab Results  Component Value Date   CHOL 175 07/13/2020   HDL 46 07/13/2020   [place] 115 (H) 07/13/2020   TRIG 72 07/13/2020  -cut back on fried/fatty foods -no statins at this time; will start with lifestyle changes        No orders of the defined types were placed in this encounter.   Follow-up: Return in about 1 year (around 07/15/2021) for Physical Exam (same-day fasting labs).    Noreene Larsson, NP

## 2020-07-15 NOTE — Assessment & Plan Note (Signed)
Lab Results  Component Value Date   CHOL 175 07/13/2020   HDL 46 07/13/2020   LDLCALC 115 (H) 07/13/2020   TRIG 72 07/13/2020   -cut back on fried/fatty foods -no statins at this time; will start with lifestyle changes

## 2020-07-15 NOTE — Assessment & Plan Note (Signed)
-  physical exam unremarkable -labs show HLD

## 2021-01-28 ENCOUNTER — Ambulatory Visit: Payer: No Typology Code available for payment source | Admitting: Nurse Practitioner

## 2021-02-02 ENCOUNTER — Ambulatory Visit (INDEPENDENT_AMBULATORY_CARE_PROVIDER_SITE_OTHER): Payer: Self-pay | Admitting: Nurse Practitioner

## 2021-02-02 ENCOUNTER — Encounter: Payer: Self-pay | Admitting: Nurse Practitioner

## 2021-02-02 ENCOUNTER — Other Ambulatory Visit: Payer: Self-pay

## 2021-02-02 VITALS — BP 130/87 | HR 72 | Ht 63.0 in | Wt 211.0 lb

## 2021-02-02 DIAGNOSIS — M79672 Pain in left foot: Secondary | ICD-10-CM

## 2021-02-02 DIAGNOSIS — Z2821 Immunization not carried out because of patient refusal: Secondary | ICD-10-CM

## 2021-02-02 DIAGNOSIS — M79671 Pain in right foot: Secondary | ICD-10-CM

## 2021-02-02 MED ORDER — IBUPROFEN 600 MG PO TABS: 600.0000 mg | ORAL_TABLET | Freq: Three times a day (TID) | ORAL | 0 refills | Status: DC | PRN

## 2021-02-02 NOTE — Patient Instructions (Signed)
Please take ibuprofen 600 mg 3 times daily as needed for your foot pain. You have been referred to podiatry for further evaluation of your foot pain.   It is important that you exercise regularly at least 30 minutes 5 times a week.  Think about what you will eat, plan ahead. Choose " clean, green, fresh or frozen" over canned, processed or packaged foods which are more sugary, salty and fatty. 70 to 75% of food eaten should be vegetables and fruit. Three meals at set times with snacks allowed between meals, but they must be fruit or vegetables. Aim to eat over a 12 hour period , example 7 am to 7 pm, and STOP after  your last meal of the day. Drink water,generally about 64 ounces per day, no other drink is as healthy. Fruit juice is best enjoyed in a healthy way, by EATING the fruit.  Thanks for choosing [facility], we consider it a privelige to serve you.

## 2021-02-02 NOTE — Assessment & Plan Note (Addendum)
Pt educated on the need to get the vaccine, states that flu vaccine made her sick the last time she had the vaccine. Pt told to get the vaccine at her pharmacy or get the vaccine here once she decides to get it.

## 2021-02-02 NOTE — Progress Notes (Signed)
° °  Kimberly Kline     MRN: [id number]      DOB: 1985-03-07   HPI Kimberly Kline is here for follow acute visits. Pt c/o bilateral foot pain, pain started first in the right foot  and then moved to the left foot.  She denied trauma. Right foot pain started two months ago, left one starred one month ago, ibuprofen does not help,Right heel has a sharp pain 6/10, left great toe area has sharp pain 8/10. she has been taking ibuprofen 400mg  TID. Left foot was swollen and warm for a while . Denies numbness or tingling. She has never had this type of pain before, its getting difficulty for her to walk. She has been wearing flat boot.Does a lot of walking on the job.   She states that she has family history of rheumatoid arthritis.   ROS Denies recent fever or chills.  Denies chest congestion, productive cough or wheezing. Denies chest pains, palpitations and leg swelling  Has bilateral foot  joint pain, swelling and limitation in mobility. Denies headaches, seizures, numbness, or tingling. Denies depression, anxiety or insomnia. Denies skin break down or rash.   PE  BP 130/87 (BP Location: Right Arm, Patient Position: Sitting, Cuff Size: Large)    Pulse 72    Ht 5\' 3"  (1.6 m)    Wt 211 lb (95.7 kg)    LMP 01/21/2021 (Approximate)    SpO2 95%    BMI 37.38 kg/m   Patient alert and oriented and in no cardiopulmonary distress.  .  Chest: Clear to auscultation bilaterally.  CVS: S1, S2 no murmurs, no S3.Regular rate.  Ext: No edema  MS: Adequate ROM spine, shoulders, hips and knees. Left heel is tender to touch, right great toe joint tenderness on palpation, has has palpable pulses on both feet. No  swelling or redness noted.   Skin: Intact, no ulcerations or rash noted.  Psych: Good eye contact, normal affect. Memory intact not anxious or depressed appearing.     Assessment & Plan

## 2021-02-11 ENCOUNTER — Other Ambulatory Visit: Payer: Self-pay

## 2021-02-11 ENCOUNTER — Ambulatory Visit (INDEPENDENT_AMBULATORY_CARE_PROVIDER_SITE_OTHER): Payer: Self-pay

## 2021-02-11 ENCOUNTER — Ambulatory Visit (INDEPENDENT_AMBULATORY_CARE_PROVIDER_SITE_OTHER): Payer: Self-pay | Admitting: Podiatry

## 2021-02-11 ENCOUNTER — Other Ambulatory Visit: Payer: Self-pay | Admitting: Podiatry

## 2021-02-11 DIAGNOSIS — M7672 Peroneal tendinitis, left leg: Secondary | ICD-10-CM

## 2021-02-11 DIAGNOSIS — M722 Plantar fascial fibromatosis: Secondary | ICD-10-CM

## 2021-02-11 DIAGNOSIS — M65271 Calcific tendinitis, right ankle and foot: Secondary | ICD-10-CM

## 2021-02-11 DIAGNOSIS — M7751 Other enthesopathy of right foot: Secondary | ICD-10-CM

## 2021-02-11 DIAGNOSIS — M79671 Pain in right foot: Secondary | ICD-10-CM

## 2021-02-11 MED ORDER — MELOXICAM 15 MG PO TABS: 15.0000 mg | ORAL_TABLET | Freq: Every day | ORAL | 0 refills | Status: DC | PRN

## 2021-02-11 NOTE — Patient Instructions (Addendum)
If was nice to meet you today. If you have any questions or any further concerns, please feel fee to give me a call. You can call our office at [phone] or please feel fee to send me a message through MyChart.   Look at getting Aetrex, Powersteps, Superfeet inserts  Plantar Fasciitis (Heel Spur Syndrome) with Rehab The plantar fascia is a fibrous, ligament-like, soft-tissue structure that spans the bottom of the foot. Plantar fasciitis is a condition that causes pain in the foot due to inflammation of the tissue. SYMPTOMS  Pain and tenderness on the underneath side of the foot. Pain that worsens with standing or walking. CAUSES  Plantar fasciitis is caused by irritation and injury to the plantar fascia on the underneath side of the foot. Common mechanisms of injury include: Direct trauma to bottom of the foot. Damage to a small nerve that runs under the foot where the main fascia attaches to the heel bone. Stress placed on the plantar fascia due to bone spurs. RISK INCREASES WITH:  Activities that place stress on the plantar fascia (running, jumping, pivoting, or cutting). Poor strength and flexibility. Improperly fitted shoes. Tight calf muscles. Flat feet. Failure to warm-up properly before activity. Obesity. PREVENTION Warm up and stretch properly before activity. Allow for adequate recovery between workouts. Maintain physical fitness: Strength, flexibility, and endurance. Cardiovascular fitness. Maintain a health body weight. Avoid stress on the plantar fascia. Wear properly fitted shoes, including arch supports for individuals who have flat feet.  PROGNOSIS  If treated properly, then the symptoms of plantar fasciitis usually resolve without surgery. However, occasionally surgery is necessary.  RELATED COMPLICATIONS  Recurrent symptoms that may result in a chronic condition. Problems of the lower back that are caused by compensating for the injury, such as limping. Pain  or weakness of the foot during push-off following surgery. Chronic inflammation, scarring, and partial or complete fascia tear, occurring more often from repeated injections.  TREATMENT  Treatment initially involves the use of ice and medication to help reduce pain and inflammation. The use of strengthening and stretching exercises may help reduce pain with activity, especially stretches of the Achilles tendon. These exercises may be performed at home or with a therapist. Your caregiver may recommend that you use heel cups of arch supports to help reduce stress on the plantar fascia. Occasionally, corticosteroid injections are given to reduce inflammation. If symptoms persist for greater than 6 months despite non-surgical (conservative), then surgery may be recommended.   MEDICATION  If pain medication is necessary, then nonsteroidal anti-inflammatory medications, such as aspirin and ibuprofen, or other minor pain relievers, such as acetaminophen, are often recommended. Do not take pain medication within 7 days before surgery. Prescription pain relievers may be given if deemed necessary by your caregiver. Use only as directed and only as much as you need. Corticosteroid injections may be given by your caregiver. These injections should be reserved for the most serious cases, because they may only be given a certain number of times.  HEAT AND COLD Cold treatment (icing) relieves pain and reduces inflammation. Cold treatment should be applied for 10 to 15 minutes every 2 to 3 hours for inflammation and pain and immediately after any activity that aggravates your symptoms. Use ice packs or massage the area with a piece of ice (ice massage). Heat treatment may be used prior to performing the stretching and strengthening activities prescribed by your caregiver, physical therapist, or athletic trainer. Use a heat pack or soak the injury in  warm water.  SEEK IMMEDIATE MEDICAL CARE IF: Treatment seems to  offer no benefit, or the condition worsens. Any medications produce adverse side effects.  EXERCISES- RANGE OF MOTION (ROM) AND STRETCHING EXERCISES - Plantar Fasciitis (Heel Spur Syndrome) These exercises may help you when beginning to rehabilitate your injury. Your symptoms may resolve with or without further involvement from your physician, physical therapist or athletic trainer. While completing these exercises, remember:  Restoring tissue flexibility helps normal motion to return to the joints. This allows healthier, less painful movement and activity. An effective stretch should be held for at least 30 seconds. A stretch should never be painful. You should only feel a gentle lengthening or release in the stretched tissue.  RANGE OF MOTION - Toe Extension, Flexion Sit with your right / left leg crossed over your opposite knee. Grasp your toes and gently pull them back toward the top of your foot. You should feel a stretch on the bottom of your toes and/or foot. Hold this stretch for 10 seconds. Now, gently pull your toes toward the bottom of your foot. You should feel a stretch on the top of your toes and or foot. Hold this stretch for 10 seconds. Repeat  times. Complete this stretch 3 times per day.   RANGE OF MOTION - Ankle Dorsiflexion, Active Assisted Remove shoes and sit on a chair that is preferably not on a carpeted surface. Place right / left foot under knee. Extend your opposite leg for support. Keeping your heel down, slide your right / left foot back toward the chair until you feel a stretch at your ankle or calf. If you do not feel a stretch, slide your bottom forward to the edge of the chair, while still keeping your heel down. Hold this stretch for 10 seconds. Repeat 3 times. Complete this stretch 2 times per day.   STRETCH  Gastroc, Standing Place hands on wall. Extend right / left leg, keeping the front knee somewhat bent. Slightly point your toes inward on your back  foot. Keeping your right / left heel on the floor and your knee straight, shift your weight toward the wall, not allowing your back to arch. You should feel a gentle stretch in the right / left calf. Hold this position for 10 seconds. Repeat 3 times. Complete this stretch 2 times per day.  STRETCH  Soleus, Standing Place hands on wall. Extend right / left leg, keeping the other knee somewhat bent. Slightly point your toes inward on your back foot. Keep your right / left heel on the floor, bend your back knee, and slightly shift your weight over the back leg so that you feel a gentle stretch deep in your back calf. Hold this position for 10 seconds. Repeat 3 times. Complete this stretch 2 times per day.  STRETCH  Gastrocsoleus, Standing  Note: This exercise can place a lot of stress on your foot and ankle. Please complete this exercise only if specifically instructed by your caregiver.  Place the ball of your right / left foot on a step, keeping your other foot firmly on the same step. Hold on to the wall or a rail for balance. Slowly lift your other foot, allowing your body weight to press your heel down over the edge of the step. You should feel a stretch in your right / left calf. Hold this position for 10 seconds. Repeat this exercise with a slight bend in your right / left knee. Repeat 3 times. Complete this stretch  2 times per day.   STRENGTHENING EXERCISES - Plantar Fasciitis (Heel Spur Syndrome)  These exercises may help you when beginning to rehabilitate your injury. They may resolve your symptoms with or without further involvement from your physician, physical therapist or athletic trainer. While completing these exercises, remember:  Muscles can gain both the endurance and the strength needed for everyday activities through controlled exercises. Complete these exercises as instructed by your physician, physical therapist or athletic trainer. Progress the resistance and  repetitions only as guided.  STRENGTH - Towel Curls Sit in a chair positioned on a non-carpeted surface. Place your foot on a towel, keeping your heel on the floor. Pull the towel toward your heel by only curling your toes. Keep your heel on the floor. Repeat 3 times. Complete this exercise 2 times per day.  STRENGTH - Ankle Inversion Secure one end of a rubber exercise band/tubing to a fixed object (table, pole). Loop the other end around your foot just before your toes. Place your fists between your knees. This will focus your strengthening at your ankle. Slowly, pull your big toe up and in, making sure the band/tubing is positioned to resist the entire motion. Hold this position for 10 seconds. Have your muscles resist the band/tubing as it slowly pulls your foot back to the starting position. Repeat 3 times. Complete this exercises 2 times per day.  Document Released: 01/03/2005 Document Revised: 03/28/2011 Document Reviewed: 04/17/2008 [facility] Patient Information 2014 [place], [place].

## 2021-02-14 NOTE — Progress Notes (Signed)
Subjective:   Patient ID: Kimberly Kline, female   DOB: 36 y.o.   MRN: [id number]   HPI 36 year old female presents the office today for concerns of bilateral foot pain.  On the right foot this is all about 2 months and she points on the first MPJ where she gets the majority of her discomfort.  She feels that it needs to be " popped" and it feels "jammed".  On the left foot this is been about 1 month that she points more to the plantar, plantar lateral heel.  No recent injury that she reports.  She states that she recently went back to working full-time at [employer] in November and her symptoms started after that.  No recent swelling.  No rating pain up the leg.  She has no other concerns today no recent treatment.   Review of Systems  All other systems reviewed and are negative.  Past Medical History:  Diagnosis Date   Acute appendicitis with localized peritonitis, without perforation or gangrene 11/28/2017   Last Assessment & Plan:  Formatting of this note might be different from the original. Patient is doing well status post laparoscopic appendectomy (11/11/17). No post operative complications apparent. They will resume regular activities at this time   GERD (gastroesophageal reflux disease)     Past Surgical History:  Procedure Laterality Date   APPENDECTOMY     DILATATION AND CURETTAGE/HYSTEROSCOPY WITH MINERVA N/A 12/26/2018   Procedure: DILATATION/HYSTEROSCOPY WITH MINERVA ENDOMETRIAL ABLATION;  Surgeon: Lazaro Arms, MD;  Location: [facility];  Service: Gynecology;  Laterality: N/A;   TUBAL LIGATION  2013     Current Outpatient Medications:    meloxicam (MOBIC) 15 MG tablet, Take 1 tablet (15 mg total) by mouth daily as needed for pain., Disp: 30 tablet, Rfl: 0   ibuprofen (ADVIL) 600 MG tablet, Take 1 tablet (600 mg total) by mouth every 8 (eight) hours as needed., Disp: 30 tablet, Rfl: 0  No Known Allergies       Objective:  Physical Exam  General: AAO x3,  NAD  Dermatological: Skin is warm, dry and supple bilateral.  There are no open sores, no preulcerative lesions, no rash or signs of infection present.  Vascular: Dorsalis Pedis artery and Posterior Tibial artery pedal pulses are 2/4 bilateral with immedate capillary fill time.  There is no pain with calf compression, swelling, warmth, erythema.   Neruologic: Grossly intact via light touch bilateral.  Negative Tinel sign.  Musculoskeletal: There is decreased medial arch upon weightbearing.  On the right foot not able to elicit any area of pinpoint tenderness.  The majority discomfort is on the first MPJ but I am not able to elicit any discomfort today.  No pain or crepitation or restriction with first MPJ range of motion.  No area pinpoint tenderness.  On the left foot the majority of tenderness on the plantar aspect heel as well as the plantar lateral heel.  Mild discomfort on the distal portion of the peroneal tendon.  There is no edema.  Flexor, extensor tendons appear to be intact.  Ankle, subtalar joint range of motion intact.  Muscular strength 5/5 in all groups tested bilateral.  Gait: Unassisted, Nonantalgic.       Assessment:   Right foot first MPJ capsulitis, left foot heel pain likely plantar fasciitis/peroneal tendinitis; flatfoot     Plan:  -Treatment options discussed including all alternatives, risks, and complications -Etiology of symptoms were discussed -X-rays were obtained and reviewed with the patient.  There is no evidence of acute fracture or stress fracture identified today. -Prescribed mobic. Discussed side effects of the medication and directed to stop if any are to occur and call the office.  -We discussed stretching, icing daily.  Discussed shoe modifications and better arch support.  Discussed various types of inserts as I do think ultimately a lot of her symptoms are coming from her foot structure having to stand on hard surfaces all day.  Discussed shoe  modifications as well.  Vivi Barrack DPM

## 2021-02-17 DIAGNOSIS — M25512 Pain in left shoulder: Secondary | ICD-10-CM | POA: Diagnosis not present

## 2021-02-17 DIAGNOSIS — X509XXA Other and unspecified overexertion or strenuous movements or postures, initial encounter: Secondary | ICD-10-CM | POA: Diagnosis not present

## 2021-02-17 DIAGNOSIS — Z87891 Personal history of nicotine dependence: Secondary | ICD-10-CM | POA: Diagnosis not present

## 2021-02-17 DIAGNOSIS — S4992XA Unspecified injury of left shoulder and upper arm, initial encounter: Secondary | ICD-10-CM | POA: Diagnosis not present

## 2021-02-23 DIAGNOSIS — M7522 Bicipital tendinitis, left shoulder: Secondary | ICD-10-CM | POA: Diagnosis not present

## 2021-02-23 DIAGNOSIS — M25512 Pain in left shoulder: Secondary | ICD-10-CM | POA: Diagnosis not present

## 2021-02-23 DIAGNOSIS — M75102 Unspecified rotator cuff tear or rupture of left shoulder, not specified as traumatic: Secondary | ICD-10-CM | POA: Diagnosis not present

## 2021-02-23 DIAGNOSIS — Z6838 Body mass index (BMI) 38.0-38.9, adult: Secondary | ICD-10-CM | POA: Diagnosis not present

## 2021-03-01 DIAGNOSIS — M67814 Other specified disorders of tendon, left shoulder: Secondary | ICD-10-CM | POA: Diagnosis not present

## 2021-03-01 DIAGNOSIS — M25512 Pain in left shoulder: Secondary | ICD-10-CM | POA: Diagnosis not present

## 2021-03-01 DIAGNOSIS — M19012 Primary osteoarthritis, left shoulder: Secondary | ICD-10-CM | POA: Diagnosis not present

## 2021-03-26 ENCOUNTER — Encounter: Payer: Self-pay | Admitting: Nurse Practitioner

## 2021-03-26 ENCOUNTER — Ambulatory Visit (INDEPENDENT_AMBULATORY_CARE_PROVIDER_SITE_OTHER): Payer: BC Managed Care – PPO | Admitting: Nurse Practitioner

## 2021-03-26 ENCOUNTER — Other Ambulatory Visit: Payer: Self-pay

## 2021-03-26 VITALS — BP 126/86 | HR 67 | Ht 63.0 in | Wt 212.0 lb

## 2021-03-26 DIAGNOSIS — E669 Obesity, unspecified: Secondary | ICD-10-CM

## 2021-03-26 DIAGNOSIS — K219 Gastro-esophageal reflux disease without esophagitis: Secondary | ICD-10-CM

## 2021-03-26 DIAGNOSIS — Z2821 Immunization not carried out because of patient refusal: Secondary | ICD-10-CM

## 2021-03-26 MED ORDER — PANTOPRAZOLE SODIUM 40 MG PO TBEC: 40.0000 mg | DELAYED_RELEASE_TABLET | Freq: Every day | ORAL | 2 refills | Status: DC

## 2021-03-26 NOTE — Assessment & Plan Note (Addendum)
Chronic condition getting worse ?Start Protonix 40 mg daily ?Patient told to take Protonix 40 mg daily for 2 weeks, once symptoms is under control patient should start taking medication only as needed. ?Avoid offending foods. ?Will refer to GI if symptoms persist. ?Patient told to use Tylenol as needed for for pain and avoid use of ibuprofen to decrease GI upset. ?

## 2021-03-26 NOTE — Assessment & Plan Note (Deleted)
Wt Readings from Last 3 Encounters:  ?03/26/21 212 lb (96.2 kg)  ?02/02/21 211 lb (95.7 kg)  ?07/15/20 209 lb 1.9 oz (94.9 kg)  ? ?She used to walk a lot but stopped. Snack all day.  ? ? ?

## 2021-03-26 NOTE — Progress Notes (Signed)
? ?  Kimberly Kline     MRN: [id number]      DOB: 01/14/1986 ? ? ?HPI ?[name] with medical history of obesity, hyperlipidemia is here for  c/o acid reflux . She has had acid reflux for years but it has gotten worse recently. Has epigastric pain, feels nauseous has bitter taste in mouth from acid reflux from her stomach. She has benn taking mylanta and tums , they help temporarily. She has been avoiding things like red sauce, chocolates. Sometimes has burning sensation in her chest going through her throat. She has been taking ibuprofen as needed for muscle pain. Denies bloody stool, vomiting, constipation diarrhea ? ? ? ?ROS ?Denies recent fever or chills. ?Denies sinus pressure, nasal congestion, ear pain or sore throat. ?Denies chest congestion, productive cough or wheezing. ?Denies chest pains, palpitations and leg swelling ?Has upper abdominal pain, nausea, denies vomiting,diarrhea or constipation.   ?Denies dysuria, frequency, hesitancy or incontinence. ?Denies joint pain, swelling and limitation in mobility. ?Denies headaches, seizures, numbness, or tingling. ?Denies depression, anxiety or insomnia. ? ? ? ?PE ? ?BP 126/86 (BP Location: Right Arm, Patient Position: Sitting, Cuff Size: Large)   Pulse 67   Ht 5\' 3"  (1.6 m)   Wt 212 lb (96.2 kg)   LMP 03/08/2021 (Exact Date)   SpO2 98%   BMI 37.55 kg/m?  ? ?Patient alert and oriented and in no cardiopulmonary distress. ? ?Chest: Clear to auscultation bilaterally. ? ?CVS: S1, S2 no murmurs, no S3.Regular rate. ? ?ABD: Soft,epigastric area tender on palpation, no masses palpated no guarding noted ? ?MS: Adequate ROM spine, shoulders, hips and knees. ? ?Skin: Intact, no ulcerations or rash noted. ? ?Psych: Good eye contact, normal affect. Memory intact not anxious or depressed appearing. ? ? ? ? ?Assessment & Plan ?Gastroesophageal reflux disease ?Chronic condition getting worse ?Start Protonix 40 mg daily ?Patient told to take Protonix 40 mg daily for 2  weeks, once symptoms is under control patient should start taking medication only as needed. ?Avoid offending foods. ?Will refer to GI if symptoms persist. ?Patient told to use Tylenol as needed for for pain and avoid use of ibuprofen to decrease GI upset. ? ?Obesity (BMI 30-39.9) ? ?Wt Readings from Last 3 Encounters:  ?03/26/21 212 lb (96.2 kg)  ?02/02/21 211 lb (95.7 kg)  ?07/15/20 209 lb 1.9 oz (94.9 kg)  ?She used to walk a lot but stopped. Snack all day. ?Need to increase intake of whole food consisting mainly of vegetables and protein less carbohydrates drinking at least 64 ounces of water daily discussed with patient.  Patient encouraged to engage in vigorous exercise 30 minutes daily 5 to 7 days a week to assist with weight loss she verbalized understanding. ?Medications for weight loss discussed with patient, patient states that she will think about this.  Benefits of maintaining healthy weight discussed with patient she verbalized understanding. ? ? ?

## 2021-03-26 NOTE — Patient Instructions (Addendum)
Please take Protonix 40 mg daily for your acid reflux.  ? ? ?It is important that you exercise regularly at least 30 minutes 5 times a week.  ?Think about what you will eat, plan ahead. ?Choose " clean, green, fresh or frozen" over canned, processed or packaged foods which are more sugary, salty and fatty. ?70 to 75% of food eaten should be vegetables and fruit. ?Three meals at set times with snacks allowed between meals, but they must be fruit or vegetables. ?Aim to eat over a 12 hour period , example 7 am to 7 pm, and STOP after  your last meal of the day. ?Drink water,generally about 64 ounces per day, no other drink is as healthy. Fruit juice is best enjoyed in a healthy way, by EATING the fruit. ? ?Thanks for choosing [facility], we consider it a privelige to serve you.  ?

## 2021-03-26 NOTE — Assessment & Plan Note (Signed)
?  Wt Readings from Last 3 Encounters:  ?03/26/21 212 lb (96.2 kg)  ?02/02/21 211 lb (95.7 kg)  ?07/15/20 209 lb 1.9 oz (94.9 kg)  ?She used to walk a lot but stopped. Snack all day. ?Need to increase intake of whole food consisting mainly of vegetables and protein less carbohydrates drinking at least 64 ounces of water daily discussed with patient.  Patient encouraged to engage in vigorous exercise 30 minutes daily 5 to 7 days a week to assist with weight loss she verbalized understanding. ?Medications for weight loss discussed with patient, patient states that she will think about this.  Benefits of maintaining healthy weight discussed with patient she verbalized understanding. ?

## 2021-07-15 ENCOUNTER — Encounter: Payer: No Typology Code available for payment source | Admitting: Nurse Practitioner

## 2021-07-16 ENCOUNTER — Ambulatory Visit (INDEPENDENT_AMBULATORY_CARE_PROVIDER_SITE_OTHER): Payer: BC Managed Care – PPO | Admitting: Family Medicine

## 2021-07-16 ENCOUNTER — Encounter: Payer: Self-pay | Admitting: Family Medicine

## 2021-07-16 VITALS — BP 129/87 | HR 68 | Ht 63.0 in | Wt 211.2 lb

## 2021-07-16 DIAGNOSIS — B009 Herpesviral infection, unspecified: Secondary | ICD-10-CM

## 2021-07-16 MED ORDER — ACYCLOVIR 400 MG PO TABS: 400.0000 mg | ORAL_TABLET | Freq: Three times a day (TID) | ORAL | 0 refills | Status: AC

## 2021-07-16 NOTE — Progress Notes (Signed)
   Acute Office Visit  Subjective:     Patient ID: Kimberly Kline, female    DOB: 1985-02-15, 36 y.o.   MRN: [id number]  Chief Complaint  Patient presents with   Mouth Lesions    Pt c/o bumps on lip onset 6 months ago.     HPI Patient is in today of a cold sore on the upper lip for 4 days. She reports using Abreva with no relief. Review of Systems  Constitutional:  Negative for chills and fever.  HENT:  Negative for congestion, nosebleeds and sinus pain.   Skin:  Positive for rash.        Objective:    BP 129/87   Pulse 68   Ht 5\' 3"  (1.6 m)   Wt 211 lb 3.2 oz (95.8 kg)   SpO2 96%   BMI 37.41 kg/m    Physical Exam HENT:     Head: Normocephalic.     Right Ear: External ear normal.     Left Ear: External ear normal.  Cardiovascular:     Rate and Rhythm: Normal rate and regular rhythm.     Pulses: Normal pulses.     Heart sounds: Normal heart sounds.  Pulmonary:     Effort: Pulmonary effort is normal.     Breath sounds: Normal breath sounds.  Skin:    Findings: Lesion (vesicular lesion on the upper lip) present.  Neurological:     Mental Status: She is alert.     No results found for any visits on 07/16/21.      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    Dispense:  15 tablet    Refill:  0    Return if symptoms worsen or fail to improve.  07/18/21, FNP

## 2021-07-16 NOTE — Patient Instructions (Signed)
I appreciate the opportunity to provide care to you today!    Please pick up your medications at the pharmacy   Please continue to a heart-healthy diet and increase your physical activities. Try to exercise for 30mins at least three times a week.      It was a pleasure to see you and I look forward to continuing to work together on your health and well-being. Please do not hesitate to call the office if you need care or have questions about your care.   Have a wonderful day and week. With Gratitude, Shabazz Mckey MSN, FNP-BC  

## 2021-07-16 NOTE — Assessment & Plan Note (Signed)
Lesion indicative of HSV-1 Will treat with oral antiviral for 5 days

## 2021-07-30 ENCOUNTER — Encounter: Payer: Self-pay | Admitting: Nurse Practitioner

## 2021-07-30 ENCOUNTER — Encounter: Payer: BC Managed Care – PPO | Admitting: Nurse Practitioner

## 2021-08-02 DIAGNOSIS — R21 Rash and other nonspecific skin eruption: Secondary | ICD-10-CM | POA: Diagnosis not present

## 2021-08-02 DIAGNOSIS — Z91030 Bee allergy status: Secondary | ICD-10-CM | POA: Diagnosis not present

## 2021-08-02 DIAGNOSIS — T63441A Toxic effect of venom of bees, accidental (unintentional), initial encounter: Secondary | ICD-10-CM | POA: Diagnosis not present

## 2021-08-02 DIAGNOSIS — Z87891 Personal history of nicotine dependence: Secondary | ICD-10-CM | POA: Diagnosis not present

## 2021-09-23 ENCOUNTER — Encounter: Payer: Self-pay | Admitting: Advanced Practice Midwife

## 2021-09-23 ENCOUNTER — Other Ambulatory Visit (HOSPITAL_COMMUNITY)
Admission: RE | Admit: 2021-09-23 | Discharge: 2021-09-23 | Disposition: A | Discharge status: Home / Self Care | Payer: BC Managed Care – PPO | Source: Ambulatory Visit | Attending: Advanced Practice Midwife | Admitting: Advanced Practice Midwife

## 2021-09-23 ENCOUNTER — Ambulatory Visit (INDEPENDENT_AMBULATORY_CARE_PROVIDER_SITE_OTHER): Payer: BC Managed Care – PPO | Admitting: Advanced Practice Midwife

## 2021-09-23 VITALS — BP 126/86 | HR 81 | Ht 63.0 in | Wt 211.0 lb

## 2021-09-23 DIAGNOSIS — Z01419 Encounter for gynecological examination (general) (routine) without abnormal findings: Secondary | ICD-10-CM

## 2021-09-23 NOTE — Progress Notes (Signed)
Kimberly Kline 36 y.o.  Vitals:   09/23/21 1544  BP: 126/86  Pulse: 81     Filed Weights   09/23/21 1544  Weight: 211 lb (95.7 kg)    Past Medical History: Past Medical History:  Diagnosis Date   Acute appendicitis with localized peritonitis, without perforation or gangrene 11/28/2017   Last Assessment & Plan:  Formatting of this note might be different from the original. Patient is doing well status post laparoscopic appendectomy (11/11/17). No post operative complications apparent. They will resume regular activities at this time   GERD (gastroesophageal reflux disease)     Past Surgical History: Past Surgical History:  Procedure Laterality Date   APPENDECTOMY     DILATATION AND CURETTAGE/HYSTEROSCOPY WITH MINERVA N/A 12/26/2018   Procedure: DILATATION/HYSTEROSCOPY WITH MINERVA ENDOMETRIAL ABLATION;  Surgeon: Lazaro Arms, MD;  Location: [facility];  Service: Gynecology;  Laterality: N/A;   TUBAL LIGATION  2013    Family History: Family History  Problem Relation Age of Onset   Hypertension Mother    Diabetes Mother    ADD / ADHD Son    Diabetes Maternal Grandmother    Hypertension Maternal Grandmother    Stroke Maternal Grandmother    Diabetes Maternal Grandfather    Hypertension Maternal Grandfather    Asthma Son    Asthma Son     Social History: Social History   Tobacco Use   Smoking status: Former    Types: Cigars    Quit date: 12/24/2006    Years since quitting: 14.7   Smokeless tobacco: Never   Tobacco comments:    2x week   Vaping Use   Vaping Use: Never used  Substance Use Topics   Alcohol use: Yes    Alcohol/week: 1.0 standard drink of alcohol    Types: 1 Standard drinks or equivalent per week    Comment: occasionally   Drug use: No    Allergies: No Known Allergies   No current outpatient medications on file.  History of Present Illness: here for pap and physical . PCP watches labs. Periods are worse w/^ caffeine intake, but still not too  bad. S/P ablation.  Using BTL for contraception.   Prior cytology:  Date Result Procedure  08/21/18 NILM w/ HRHPV negative None                 Review of Systems   Patient denies any headaches, blurred vision, shortness of breath, chest pain, abdominal pain, problems with bowel movements, urination, or intercourse.   Physical Exam: General:  Well developed, well nourished, no acute distress Skin:  Warm and dry Neck:  Midline trachea, normal thyroid Lungs; Clear to auscultation bilaterally Breast:  No dominant palpable mass, retraction, or nipple discharge Cardiovascular: Regular rate and rhythm Abdomen:  Soft, non tender, no hepatosplenomegaly Pelvic:  External genitalia is normal in appearance.  The vagina is normal in appearance.  The cervix is bulbous.  Uterus is felt to be normal size, shape, and contour.  No adnexal masses or tenderness noted. Exam limited by habitus Extremities:  No swelling or varicosities noted Psych:  No mood changes.     Impression: normal GYN exam     Plan: if pap normal, repeat in 3 years Mammogram age 36

## 2021-10-04 LAB — CYTOLOGY - PAP
Chlamydia: NEGATIVE
Comment: NEGATIVE
Comment: NEGATIVE
Comment: NORMAL
Diagnosis: UNDETERMINED — AB
High risk HPV: NEGATIVE
Neisseria Gonorrhea: NEGATIVE

## 2021-11-23 DIAGNOSIS — J029 Acute pharyngitis, unspecified: Secondary | ICD-10-CM | POA: Diagnosis not present

## 2021-12-06 ENCOUNTER — Ambulatory Visit (HOSPITAL_COMMUNITY)
Admission: RE | Admit: 2021-12-06 | Discharge: 2021-12-06 | Disposition: A | Discharge status: Home / Self Care | Payer: BC Managed Care – PPO | Source: Ambulatory Visit | Attending: Internal Medicine | Admitting: Internal Medicine

## 2021-12-06 ENCOUNTER — Ambulatory Visit (INDEPENDENT_AMBULATORY_CARE_PROVIDER_SITE_OTHER): Payer: BC Managed Care – PPO | Admitting: Internal Medicine

## 2021-12-06 ENCOUNTER — Encounter: Payer: Self-pay | Admitting: Internal Medicine

## 2021-12-06 VITALS — BP 125/82 | HR 67 | Ht 63.0 in | Wt 208.0 lb

## 2021-12-06 DIAGNOSIS — R052 Subacute cough: Secondary | ICD-10-CM | POA: Insufficient documentation

## 2021-12-06 DIAGNOSIS — R079 Chest pain, unspecified: Secondary | ICD-10-CM | POA: Diagnosis not present

## 2021-12-06 DIAGNOSIS — R059 Cough, unspecified: Secondary | ICD-10-CM | POA: Diagnosis not present

## 2021-12-06 MED ORDER — DEXTROMETHORPHAN HBR 15 MG/5ML PO SYRP: 10.0000 mL | ORAL_SOLUTION | Freq: Four times a day (QID) | ORAL | 0 refills | Status: DC | PRN

## 2021-12-06 NOTE — Patient Instructions (Addendum)
Thank you for trusting me with your care. To recap, today we discussed the following:  1. Subacute cough - DG Chest 2 View - CBC with Differential/Platelet - dextromethorphan 15 MG/5ML syrup; Take 10 mLs (30 mg total) by mouth 4 (four) times daily as needed for cough.  Dispense: 120 mL; Refill: 0   I will follow up with results.

## 2021-12-06 NOTE — Assessment & Plan Note (Addendum)
Patient woke up in the morning with sore throat and some cough. Patient went to [facility] [place] to be evaluated for cough. She tested negative for strep throat and treated with prednisone dose pack. Sore throat has improved but a cough has continued.  The cough is constant for 2-3 weeks  , productive of clear sputum, and she is sleeping elevated because of increase sputum production. The cough is productive. It is clear. No nasal congestion. She has noticed chest pain on right side of her chest going to sternum.This pain developed after weeks of coughing.  She has tried mucinex. No fevers. Patient does not smoke and no second hand smoke. No new pets . No history of allergies. No GERD symptoms. No rhinorrhea. Will send   Assessment/Plan:Subacute Cough. - DG Chest 2 View - CBC with Differential/Platelet - dextromethorphan 15 MG/5ML syrup; Take 10 mLs (30 mg total) by mouth 4 (four) times daily as needed for cough.  Dispense: 120 mL; Refill: 0 - Follow up if cough does not improve.

## 2021-12-06 NOTE — Progress Notes (Signed)
     CC: Follow-up (Er follow up still having chest pains)    HPI:[name] is a 36 y.o. female who presents for evaluation of cough. For the details of today's visit, please refer to the assessment and plan.     Past Medical History:  Diagnosis Date   Acute appendicitis with localized peritonitis, without perforation or gangrene 11/28/2017   Last Assessment & Plan:  Formatting of this note might be different from the original. Patient is doing well status post laparoscopic appendectomy (11/11/17). No post operative complications apparent. They will resume regular activities at this time   GERD (gastroesophageal reflux disease)      Physical Exam: Vitals:   12/06/21 0854  BP: 125/82  Pulse: 67  SpO2: 97%  Weight: 208 lb 0.6 oz (94.4 kg)  Height: 5\' 3"  (1.6 m)     Physical Exam Constitutional:      General: She is not in acute distress.    Appearance: She is not ill-appearing.  HENT:     Right Ear: Tympanic membrane normal.     Left Ear: Tympanic membrane normal.     Nose: No congestion or rhinorrhea.     Mouth/Throat:     Mouth: Mucous membranes are moist.     Pharynx: No oropharyngeal exudate or posterior oropharyngeal erythema.  Eyes:     General:        Right eye: No discharge.        Left eye: No discharge.     Conjunctiva/sclera: Conjunctivae normal.  Cardiovascular:     Rate and Rhythm: Normal rate and regular rhythm.     Pulses: Normal pulses.     Heart sounds: Normal heart sounds. No murmur heard. Pulmonary:     Effort: Pulmonary effort is normal. No respiratory distress.     Breath sounds: No wheezing or rales.  Musculoskeletal:     Right lower leg: No edema.     Left lower leg: No edema.  Skin:    General: Skin is warm and dry.      Assessment & Plan:   Subacute cough Patient woke up in the morning with sore throat and some cough. Patient went to [facility] [place] to be evaluated for cough. She tested negative for strep throat and  treated with prednisone dose pack. Sore throat has improved but a cough has continued.  The cough is constant for 2-3 weeks  , productive of clear sputum, and she is sleeping elevated because of increase sputum production. The cough is productive. It is clear. No nasal congestion. She has noticed chest pain on right side of her chest going to sternum.This pain developed after weeks of coughing.  She has tried mucinex. No fevers. Patient does not smoke and no second hand smoke. No new pets . No history of allergies. No GERD symptoms. No rhinorrhea. Will send   Assessment/Plan:Subacute Cough. - DG Chest 2 View - CBC with Differential/Platelet - dextromethorphan 15 MG/5ML syrup; Take 10 mLs (30 mg total) by mouth 4 (four) times daily as needed for cough.  Dispense: 120 mL; Refill: 0 - Follow up if cough does not improve.      [facility], MD

## 2021-12-07 LAB — CBC WITH DIFFERENTIAL/PLATELET
Basophils Absolute: 0 10*3/uL (ref 0.0–0.2)
Basos: 1 %
EOS (ABSOLUTE): 0.1 10*3/uL (ref 0.0–0.4)
Eos: 2 %
Hematocrit: 38.8 % (ref 34.0–46.6)
Hemoglobin: 12.4 g/dL (ref 11.1–15.9)
Immature Grans (Abs): 0 10*3/uL (ref 0.0–0.1)
Immature Granulocytes: 0 %
Lymphocytes Absolute: 1.7 10*3/uL (ref 0.7–3.1)
Lymphs: 27 %
MCH: 30.6 pg (ref 26.6–33.0)
MCHC: 32 g/dL (ref 31.5–35.7)
MCV: 96 fL (ref 79–97)
Monocytes Absolute: 0.5 10*3/uL (ref 0.1–0.9)
Monocytes: 8 %
Neutrophils Absolute: 4 10*3/uL (ref 1.4–7.0)
Neutrophils: 62 %
Platelets: 246 10*3/uL (ref 150–450)
RBC: 4.05 x10E6/uL (ref 3.77–5.28)
RDW: 12.6 % (ref 11.7–15.4)
WBC: 6.4 10*3/uL (ref 3.4–10.8)

## 2021-12-12 DIAGNOSIS — R059 Cough, unspecified: Secondary | ICD-10-CM | POA: Diagnosis not present

## 2021-12-12 DIAGNOSIS — R079 Chest pain, unspecified: Secondary | ICD-10-CM | POA: Diagnosis not present

## 2021-12-12 DIAGNOSIS — X58XXXA Exposure to other specified factors, initial encounter: Secondary | ICD-10-CM | POA: Diagnosis not present

## 2021-12-12 DIAGNOSIS — S29011A Strain of muscle and tendon of front wall of thorax, initial encounter: Secondary | ICD-10-CM | POA: Diagnosis not present

## 2021-12-12 DIAGNOSIS — R0789 Other chest pain: Secondary | ICD-10-CM | POA: Diagnosis not present

## 2021-12-12 DIAGNOSIS — H9203 Otalgia, bilateral: Secondary | ICD-10-CM | POA: Diagnosis not present

## 2021-12-12 DIAGNOSIS — K219 Gastro-esophageal reflux disease without esophagitis: Secondary | ICD-10-CM | POA: Diagnosis not present

## 2021-12-15 ENCOUNTER — Encounter: Payer: Self-pay | Admitting: Family Medicine

## 2021-12-15 ENCOUNTER — Ambulatory Visit (INDEPENDENT_AMBULATORY_CARE_PROVIDER_SITE_OTHER): Payer: BC Managed Care – PPO | Admitting: Family Medicine

## 2021-12-15 VITALS — BP 127/75 | HR 81 | Ht 63.0 in | Wt 214.0 lb

## 2021-12-15 DIAGNOSIS — J329 Chronic sinusitis, unspecified: Secondary | ICD-10-CM

## 2021-12-15 DIAGNOSIS — R052 Subacute cough: Secondary | ICD-10-CM

## 2021-12-15 DIAGNOSIS — G4483 Primary cough headache: Secondary | ICD-10-CM

## 2021-12-15 DIAGNOSIS — R0981 Nasal congestion: Secondary | ICD-10-CM | POA: Diagnosis not present

## 2021-12-15 DIAGNOSIS — B9789 Other viral agents as the cause of diseases classified elsewhere: Secondary | ICD-10-CM

## 2021-12-15 MED ORDER — DEXTROMETHORPHAN HBR 15 MG/5ML PO SYRP: 10.0000 mL | ORAL_SOLUTION | Freq: Four times a day (QID) | ORAL | 0 refills | Status: DC | PRN

## 2021-12-15 MED ORDER — NOREL AD 4-10-325 MG PO TABS: ORAL_TABLET | ORAL | 0 refills | Status: DC

## 2021-12-15 MED ORDER — AZELASTINE HCL 0.1 % NA SOLN: 2.0000 | Freq: Two times a day (BID) | NASAL | 12 refills | Status: DC

## 2021-12-15 NOTE — Assessment & Plan Note (Addendum)
Viral sinusitis Inform the patient that Viral sinusitis is self-limiting Encouraged to call the practice if symptoms worsen or do not resolve within 10 days of symptom onset Will treat symptomatically with Norel AD Azelastine nasal spray or ordered to relieve nasal congestion Will assess patient for COVID and flu

## 2021-12-15 NOTE — Patient Instructions (Addendum)
I appreciate the opportunity to provide care to you today!    Follow up:  3 months  You are being treated symptomatically today for viral sinusitis Viral sinusitis usually goes away on its own, but please let me know if your symptoms worsen or do not go away on its own within 10 days from symptom onset  Please pick up your medication at the pharmacy and start therapy  You will be contacted with the results of your COVID and flu testing today  Please continue to a heart-healthy diet and increase your physical activities. Try to exercise for at least three times a week.      It was a pleasure to see you and I look forward to continuing to work together on your health and well-being. Please do not hesitate to call the office if you need care or have questions about your care.   Have a wonderful day and week. With Gratitude, Gilmore Laroche MSN, FNP-BC

## 2021-12-15 NOTE — Progress Notes (Signed)
Acute Office Visit  Subjective:    Patient ID: Kimberly Kline, female    DOB: Mar 14, 1985, 36 y.o.   MRN: [id number]  Chief Complaint  Patient presents with   Sinus Problem    Pt reports sinus pressure, nasal congestion and burning in her nose, mucus is clear, head congestion, a lot of sneezing, had chills last night sx started 2 days ago, 12/13/2021.     HPI Patient is in today with complaints of sinus pressure, nasal congestion, rhinorrhea, coughing, sneezing, and sinus headache since 12/13/2021.  She reports no sick contacts and has not taken any medication over-the-counter.  She denies fever, chest pain, nausea, vomiting, and diarrhea.  Past Medical History:  Diagnosis Date   Acute appendicitis with localized peritonitis, without perforation or gangrene 11/28/2017   Last Assessment & Plan:  Formatting of this note might be different from the original. Patient is doing well status post laparoscopic appendectomy (11/11/17). No post operative complications apparent. They will resume regular activities at this time   GERD (gastroesophageal reflux disease)     Past Surgical History:  Procedure Laterality Date   APPENDECTOMY     DILATATION AND CURETTAGE/HYSTEROSCOPY WITH MINERVA N/A 12/26/2018   Procedure: DILATATION/HYSTEROSCOPY WITH MINERVA ENDOMETRIAL ABLATION;  Surgeon: Lazaro Arms, MD;  Location: [facility];  Service: Gynecology;  Laterality: N/A;   TUBAL LIGATION  2013    Family History  Problem Relation Age of Onset   Hypertension Mother    Diabetes Mother    ADD / ADHD Son    Diabetes Maternal Grandmother    Hypertension Maternal Grandmother    Stroke Maternal Grandmother    Diabetes Maternal Grandfather    Hypertension Maternal Grandfather    Asthma Son    Asthma Son     Social History   Socioeconomic History   Marital status: Married    Spouse name: Not on file   Number of children: 3   Years of education: 20   Highest education level: Not on file   Occupational History   Occupation: assembly    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, forensic  Tobacco Use   Smoking status: Former    Types: Cigars    Quit date: 12/24/2006    Years since quitting: 14.9   Smokeless tobacco: Never   Tobacco comments:    2x week   Vaping Use   Vaping Use: Never used  Substance and Sexual Activity   Alcohol use: Yes    Alcohol/week: 1.0 standard drink of alcohol    Types: 1 Standard drinks or equivalent per week    Comment: occasionally   Drug use: No   Sexual activity: Yes    Birth control/protection: Surgical    Comment: tubal and ablation  Other Topics Concern   Not on file  Social History Narrative   Not on file   Social Determinants of Health   Financial Resource Strain: Low Risk  (09/23/2021)   Overall Financial Resource Strain (CARDIA)    Difficulty of Paying Living Expenses: Not very hard  Food Insecurity: No Food Insecurity (09/23/2021)   Hunger Vital Sign    Worried About Running Out of Food in the Last Year: Never true    Ran Out of Food in the Last Year: Never true  Transportation Needs: No Transportation Needs (09/23/2021)   PRAPARE - Administrator, Civil Service (Medical): No    Lack of Transportation (Non-Medical):  No  Physical Activity: Inactive (09/23/2021)   Exercise Vital Sign    Days of Exercise per Week: 0 days    Minutes of Exercise per Session: 0 min  Stress: No Stress Concern Present (09/23/2021)   [employer] of Occupational Health - Occupational Stress Questionnaire    Feeling of Stress : Only a little  Social Connections: Moderately Integrated (09/23/2021)   Social Connection and Isolation Panel [NHANES]    Frequency of Communication with Friends and Family: Twice a week    Frequency of Social Gatherings with Friends and Family: Once a week    Attends Religious Services: 1 to 4 times per year    Active Member of [employer] or Organizations: No     Attends Banker Meetings: Never    Marital Status: Married  Catering manager Violence: Not At Risk (09/23/2021)   Humiliation, Afraid, Rape, and Kick questionnaire    Fear of Current or Ex-Partner: No    Emotionally Abused: No    Physically Abused: No    Sexually Abused: No    Outpatient Medications Prior to Visit  Medication Sig Dispense Refill   dextromethorphan 15 MG/5ML syrup Take 10 mLs (30 mg total) by mouth 4 (four) times daily as needed for cough. (Patient not taking: Reported on 12/15/2021) 120 mL 0   No facility-administered medications prior to visit.    No Known Allergies  Review of Systems  Constitutional:  Negative for fatigue and fever.  HENT:  Positive for congestion, rhinorrhea, sinus pressure, sinus pain and sneezing.   Respiratory:  Positive for cough. Negative for chest tightness and wheezing.   Cardiovascular:  Negative for chest pain, palpitations and leg swelling.  Neurological:  Positive for headaches.       Objective:    Physical Exam HENT:     Head: Normocephalic.     Right Ear: External ear normal.     Left Ear: External ear normal.     Nose:     Right Sinus: Frontal sinus tenderness present.     Left Sinus: Frontal sinus tenderness present.  Cardiovascular:     Rate and Rhythm: Normal rate and regular rhythm.     Pulses: Normal pulses.     Heart sounds: Normal heart sounds.  Pulmonary:     Effort: Pulmonary effort is normal.     Breath sounds: Normal breath sounds.  Neurological:     Mental Status: She is alert.     BP 127/75   Pulse 81   Ht 5\' 3"  (1.6 m)   Wt 214 lb (97.1 kg)   SpO2 97%   BMI 37.91 kg/m  Wt Readings from Last 3 Encounters:  12/15/21 214 lb (97.1 kg)  12/06/21 208 lb 0.6 oz (94.4 kg)  09/23/21 211 lb (95.7 kg)       Assessment & Plan:  Nasal congestion  Cough headache -     Coronavirus (COVID-19) with Influenza A and Influenza B  Viral sinusitis Assessment & Plan: Viral  sinusitis Inform the patient that Viral sinusitis is self-limiting Encouraged to call the practice if symptoms worsen or do not resolve within 10 days of symptom onset Will treat symptomatically with Norel AD Azelastine nasal spray or ordered to relieve nasal congestion Will assess patient for COVID and flu  Orders: -     Norel AD; Take 1 tablet every 4 hours while symptoms persists. Do not take more than 6 tablets in 24 hours.  Dispense: 20 tablet; Refill: 0 -  Azelastine HCl; Place 2 sprays into both nostrils 2 (two) times daily. Use in each nostril as directed  Dispense: 30 mL; Refill: 12  Subacute cough -     Dextromethorphan HBr; Take 10 mLs (30 mg total) by mouth 4 (four) times daily as needed for cough.  Dispense: 120 mL; Refill: 0    Gilmore Laroche, FNP

## 2021-12-17 LAB — COVID-19, FLU A+B NAA
Influenza A, NAA: NOT DETECTED
Influenza B, NAA: NOT DETECTED
SARS-CoV-2, NAA: NOT DETECTED

## 2022-01-17 DIAGNOSIS — Z20822 Contact with and (suspected) exposure to covid-19: Secondary | ICD-10-CM | POA: Diagnosis not present

## 2022-01-17 DIAGNOSIS — R519 Headache, unspecified: Secondary | ICD-10-CM | POA: Diagnosis not present

## 2022-01-17 DIAGNOSIS — B349 Viral infection, unspecified: Secondary | ICD-10-CM | POA: Diagnosis not present

## 2022-01-17 DIAGNOSIS — Z1152 Encounter for screening for COVID-19: Secondary | ICD-10-CM | POA: Diagnosis not present

## 2022-01-17 DIAGNOSIS — R42 Dizziness and giddiness: Secondary | ICD-10-CM | POA: Diagnosis not present

## 2022-01-17 DIAGNOSIS — R509 Fever, unspecified: Secondary | ICD-10-CM | POA: Diagnosis not present

## 2022-01-17 DIAGNOSIS — J111 Influenza due to unidentified influenza virus with other respiratory manifestations: Secondary | ICD-10-CM | POA: Diagnosis not present

## 2022-03-16 ENCOUNTER — Ambulatory Visit: Payer: BC Managed Care – PPO | Admitting: Family Medicine

## 2022-03-17 ENCOUNTER — Ambulatory Visit: Payer: BC Managed Care – PPO | Admitting: Family Medicine

## 2022-03-17 ENCOUNTER — Telehealth: Payer: Self-pay | Admitting: Family Medicine

## 2022-03-17 NOTE — Telephone Encounter (Signed)
Pt has missed 3 appts ( 01/28/21, 07/30/21 & 03/17/22). How would you like to proceed?

## 2022-03-17 NOTE — Telephone Encounter (Signed)
Kindly proceed with the clinic policy, given the patient has been fully aware of the clinic's policy and subsequent actions.

## 2022-03-18 ENCOUNTER — Encounter: Payer: Self-pay | Admitting: Family Medicine

## 2022-04-18 DIAGNOSIS — S8012XA Contusion of left lower leg, initial encounter: Secondary | ICD-10-CM | POA: Diagnosis not present

## 2022-04-18 DIAGNOSIS — M79606 Pain in leg, unspecified: Secondary | ICD-10-CM | POA: Diagnosis not present

## 2022-04-18 DIAGNOSIS — S46012A Strain of muscle(s) and tendon(s) of the rotator cuff of left shoulder, initial encounter: Secondary | ICD-10-CM | POA: Diagnosis not present

## 2022-04-18 DIAGNOSIS — M25512 Pain in left shoulder: Secondary | ICD-10-CM | POA: Diagnosis not present

## 2022-04-18 DIAGNOSIS — W1789XA Other fall from one level to another, initial encounter: Secondary | ICD-10-CM | POA: Diagnosis not present

## 2022-04-20 ENCOUNTER — Telehealth: Payer: Self-pay

## 2022-04-20 NOTE — Transitions of Care (Post Inpatient/ED Visit) (Signed)
   04/20/2022  Name: Kimberly Kline MRN: [id number] DOB: 30-Apr-1985  Today's TOC FU Call Status: Today's TOC FU Call Status:: Unsuccessul Call (1st Attempt) Unsuccessful Call (1st Attempt) Date: 04/20/22  Attempted to reach the patient regarding the most recent Inpatient/ED visit.  Follow Up Plan: Additional outreach attempts will be made to reach the patient to complete the Transitions of Care (Post Inpatient/ED visit) call.   Norton Blizzard, [place] ([facility])  [facility] [phone]

## 2022-05-11 DIAGNOSIS — Z1152 Encounter for screening for COVID-19: Secondary | ICD-10-CM | POA: Diagnosis not present

## 2022-05-11 DIAGNOSIS — S0990XA Unspecified injury of head, initial encounter: Secondary | ICD-10-CM | POA: Diagnosis not present

## 2022-05-11 DIAGNOSIS — S0090XA Unspecified superficial injury of unspecified part of head, initial encounter: Secondary | ICD-10-CM | POA: Diagnosis not present

## 2022-05-11 DIAGNOSIS — F1090 Alcohol use, unspecified, uncomplicated: Secondary | ICD-10-CM | POA: Diagnosis not present

## 2022-05-11 DIAGNOSIS — W228XXA Striking against or struck by other objects, initial encounter: Secondary | ICD-10-CM | POA: Diagnosis not present

## 2022-08-09 DIAGNOSIS — B301 Conjunctivitis due to adenovirus: Secondary | ICD-10-CM | POA: Diagnosis not present

## 2022-08-23 DIAGNOSIS — R079 Chest pain, unspecified: Secondary | ICD-10-CM | POA: Diagnosis not present

## 2022-08-23 DIAGNOSIS — R519 Headache, unspecified: Secondary | ICD-10-CM | POA: Diagnosis not present

## 2022-08-23 DIAGNOSIS — R202 Paresthesia of skin: Secondary | ICD-10-CM | POA: Diagnosis not present

## 2022-08-23 DIAGNOSIS — M542 Cervicalgia: Secondary | ICD-10-CM | POA: Diagnosis not present

## 2022-08-23 DIAGNOSIS — M79601 Pain in right arm: Secondary | ICD-10-CM | POA: Diagnosis not present

## 2022-08-23 DIAGNOSIS — R200 Anesthesia of skin: Secondary | ICD-10-CM | POA: Diagnosis not present

## 2022-09-09 DIAGNOSIS — R29898 Other symptoms and signs involving the musculoskeletal system: Secondary | ICD-10-CM | POA: Diagnosis not present

## 2022-09-09 DIAGNOSIS — R200 Anesthesia of skin: Secondary | ICD-10-CM | POA: Diagnosis not present

## 2022-09-09 DIAGNOSIS — R209 Unspecified disturbances of skin sensation: Secondary | ICD-10-CM | POA: Diagnosis not present

## 2022-09-09 DIAGNOSIS — R002 Palpitations: Secondary | ICD-10-CM | POA: Diagnosis not present

## 2022-09-20 ENCOUNTER — Ambulatory Visit: Payer: BC Managed Care – PPO | Attending: Neurology

## 2022-09-20 ENCOUNTER — Telehealth: Payer: Self-pay | Admitting: *Deleted

## 2022-09-20 DIAGNOSIS — I480 Paroxysmal atrial fibrillation: Secondary | ICD-10-CM

## 2022-09-20 NOTE — Telephone Encounter (Signed)
Received a fax from [facility] requesting a 14 day Zio patch for PAF. Requesting provider is Jorge Mandril, FNP. Order placed.

## 2022-10-04 DIAGNOSIS — K219 Gastro-esophageal reflux disease without esophagitis: Secondary | ICD-10-CM | POA: Diagnosis not present

## 2022-10-04 DIAGNOSIS — M542 Cervicalgia: Secondary | ICD-10-CM | POA: Diagnosis not present

## 2022-10-04 DIAGNOSIS — R200 Anesthesia of skin: Secondary | ICD-10-CM | POA: Diagnosis not present

## 2022-11-24 DIAGNOSIS — M545 Low back pain, unspecified: Secondary | ICD-10-CM | POA: Diagnosis not present

## 2022-11-24 DIAGNOSIS — M5416 Radiculopathy, lumbar region: Secondary | ICD-10-CM | POA: Diagnosis not present

## 2023-01-12 DIAGNOSIS — T3695XA Adverse effect of unspecified systemic antibiotic, initial encounter: Secondary | ICD-10-CM | POA: Diagnosis not present

## 2023-01-12 DIAGNOSIS — H9201 Otalgia, right ear: Secondary | ICD-10-CM | POA: Diagnosis not present

## 2023-01-12 DIAGNOSIS — H6691 Otitis media, unspecified, right ear: Secondary | ICD-10-CM | POA: Diagnosis not present

## 2023-01-12 DIAGNOSIS — B379 Candidiasis, unspecified: Secondary | ICD-10-CM | POA: Diagnosis not present

## 2023-03-02 ENCOUNTER — Ambulatory Visit: Payer: Self-pay

## 2023-09-28 ENCOUNTER — Emergency Department (HOSPITAL_COMMUNITY)
Admission: EM | Admit: 2023-09-28 | Discharge: 2023-09-28 | Disposition: A | Discharge status: Home / Self Care | Attending: Emergency Medicine | Admitting: Emergency Medicine

## 2023-09-28 ENCOUNTER — Emergency Department (HOSPITAL_COMMUNITY)

## 2023-09-28 ENCOUNTER — Encounter (HOSPITAL_COMMUNITY): Payer: Self-pay

## 2023-09-28 ENCOUNTER — Other Ambulatory Visit: Payer: Self-pay

## 2023-09-28 DIAGNOSIS — R202 Paresthesia of skin: Secondary | ICD-10-CM

## 2023-09-28 DIAGNOSIS — R200 Anesthesia of skin: Secondary | ICD-10-CM | POA: Diagnosis present

## 2023-09-28 LAB — DIFFERENTIAL
Abs Immature Granulocytes: 0.03 K/uL (ref 0.00–0.07)
Basophils Absolute: 0 K/uL (ref 0.0–0.1)
Basophils Relative: 0 %
Eosinophils Absolute: 0.1 K/uL (ref 0.0–0.5)
Eosinophils Relative: 1 %
Immature Granulocytes: 0 %
Lymphocytes Relative: 21 %
Lymphs Abs: 2 K/uL (ref 0.7–4.0)
Monocytes Absolute: 0.6 K/uL (ref 0.1–1.0)
Monocytes Relative: 6 %
Neutro Abs: 6.9 K/uL (ref 1.7–7.7)
Neutrophils Relative %: 72 %

## 2023-09-28 LAB — CBC
HCT: 36.2 % (ref 36.0–46.0)
Hemoglobin: 12 g/dL (ref 12.0–15.0)
MCH: 31.5 pg (ref 26.0–34.0)
MCHC: 33.1 g/dL (ref 30.0–36.0)
MCV: 95 fL (ref 80.0–100.0)
Platelets: 273 K/uL (ref 150–400)
RBC: 3.81 MIL/uL — ABNORMAL LOW (ref 3.87–5.11)
RDW: 12.9 % (ref 11.5–15.5)
WBC: 9.6 K/uL (ref 4.0–10.5)
nRBC: 0 % (ref 0.0–0.2)

## 2023-09-28 LAB — COMPREHENSIVE METABOLIC PANEL WITH GFR
ALT: 14 U/L (ref 0–44)
AST: 15 U/L (ref 15–41)
Albumin: 3.7 g/dL (ref 3.5–5.0)
Alkaline Phosphatase: 37 U/L — ABNORMAL LOW (ref 38–126)
Anion gap: 11 (ref 5–15)
BUN: 20 mg/dL (ref 6–20)
CO2: 23 mmol/L (ref 22–32)
Calcium: 9 mg/dL (ref 8.9–10.3)
Chloride: 102 mmol/L (ref 98–111)
Creatinine, Ser: 0.73 mg/dL (ref 0.44–1.00)
GFR, Estimated: >60 mL/min (ref >60)
Glucose, Bld: 84 mg/dL (ref 70–99)
Potassium: 3.9 mmol/L (ref 3.5–5.1)
Sodium: 136 mmol/L (ref 135–145)
Total Bilirubin: 0.4 mg/dL (ref 0.0–1.2)
Total Protein: 8 g/dL (ref 6.5–8.1)

## 2023-09-28 LAB — I-STAT CHEM 8, ED
BUN: 21 mg/dL — ABNORMAL HIGH (ref 6–20)
Calcium, Ion: 1.25 mmol/L (ref 1.15–1.40)
Chloride: 105 mmol/L (ref 98–111)
Creatinine, Ser: 0.9 mg/dL (ref 0.44–1.00)
Glucose, Bld: 81 mg/dL (ref 70–99)
HCT: 39 % (ref 36.0–46.0)
Hemoglobin: 13.3 g/dL (ref 12.0–15.0)
Potassium: 4 mmol/L (ref 3.5–5.1)
Sodium: 139 mmol/L (ref 135–145)
TCO2: 24 mmol/L (ref 22–32)

## 2023-09-28 LAB — PROTIME-INR
INR: 1 (ref 0.8–1.2)
Prothrombin Time: 13.7 s (ref 11.4–15.2)

## 2023-09-28 LAB — ETHANOL: Alcohol, Ethyl (B): <15 mg/dL (ref <15)

## 2023-09-28 LAB — APTT: aPTT: 26 s (ref 24–36)

## 2023-09-28 LAB — CBG MONITORING, ED: Glucose-Capillary: 77 mg/dL (ref 70–99)

## 2023-09-28 NOTE — Discharge Instructions (Signed)
Follow-up with your family doctor next week if any problems 

## 2023-09-28 NOTE — ED Notes (Addendum)
 Dr. Rosemarie decided to do MRI to rule out any stroke; No TNK at this time.

## 2023-09-28 NOTE — ED Triage Notes (Addendum)
 Pt reports sudden rt side face numbness, finger numbness, and foot numbness starting around 1300. Pt has hx of stroke.

## 2023-09-28 NOTE — Progress Notes (Signed)
 [facility] Telemedicine Consult   Requesting Provider: Dr Towana Consult Participants: patient, bedside RN and elink RN [facility]ly [facility] Location of the provider: [facility] Location of the patient: [facility]  This consult was provided via telemedicine with 2-way video and audio communication. The patient/family was informed that care would be provided in this way and agreed to receive care in this manner.    Chief Complaint: Left face and right body numbness  HPI: Kimberly Kline is a 38 year old pleasant lady with presents with sudden onset of numbness involving left side of the face and right side of the body at 1 PM today.  Patient said numbness involve the whole left side of the face and right hand fingertips as well as the right leg involving the toes.  Symptoms have persisted and have not gotten better or worse.  She denies any significant headache, double vision, vertigo, speech difficulty or extremity weakness.  She denies gait difficulty though still feels feels that her legs feel slightly heavy.  She has had similar episode on 08/22/2022 when she was seen at [facility].  MRI scan of the brain and cervical spine were unremarkable.  CT angiogram of the brain and neck was also normal.  She was not told that she had a stroke but she subsequently saw an outpatient neurologist in [place] on 09/13/2023.  Who told her that she may have had a small stroke.  I however cannot review his records and care everywhere.  She denies significant vascular risk factors in the form of hypertension, diabetes, hyperlipidemia or smoking.  She denies any other prior symptoms or history of multiple sclerosis in the form of vision loss, vertigo bladder incontinence significant fatigue or gait or balance problems.  She denies any prior history of migraines seizures.    LKW: 1 PM tnk given?: No, symptoms too mild to treat IR Thrombectomy? No, clinical presentation not suggestive of  LVO Modified Rankin Scale: 0-Completely asymptomatic and back to baseline post- stroke Time of teleneurologist evaluation: 1427  Exam: Vitals:   09/28/23 1415 09/28/23 1430  BP: 124/79 127/86  Pulse: 67 71  Resp: 17 18  SpO2: 98% 97%    General: Pleasant young African-American lady not in distress.  1A: Level of Consciousness -0 1B: Ask Month and Age -7 1C: 'Blink Eyes' & 'Squeeze Hands' -0 2: Test Horizontal Extraocular Movements -0 3: Test Visual Fields -0 4: Test Facial Palsy -0 5A: Test Left Arm Motor Drift -0 5B: Test Right Arm Motor Drift -0 6A: Test Left Leg Motor Drift -0 6B: Test Right Leg Motor Drift -0 7: Test Limb Ataxia -0 8: Test Sensation - 1 9: Test Language/Aphasia-0 10: Test Dysarthria -0 11: Test Extinction/Inattention -0 NIHSS score: 1   Imaging Reviewed: CT head no acute abnormality.  Aspects score 10 08/23/2022 at [facility] in [place] MRI scan of the brain no acute abnormality.  MRI scan C-spine normal.  CT angiogram brain and neck normal. Labs reviewed in epic and pertinent values follow: None at this time   Assessment: 38 year old lady with sudden onset of left face and right body paresthesias of unclear etiology.  Doubt stroke given young age and absence of risk factors and similar episode 1 year ago with negative neurovascular imaging.  Recommendations: Check stat MRI scan of the brain and if negative for stroke patient could be discharged home.  If MRI confirms a small brainstem infarct patient needs to be admitted for further stroke workup and call neurology  back for further instructions. Long discussion with patient and answered questions.  Discussed with bedside RN and with Dr. Towana ER MD   This patient is receiving care for possible acute neurological changes. There was 55 minutes of care by this provider at the time of service, including time for direct evaluation via telemedicine, review of medical records, imaging studies and discussion of  findings with providers, the patient and/or family.  Kimberly Popp MD [facility] [phone]  If 7pm- 7am, please page neurology on call as listed in AMION.

## 2023-09-28 NOTE — ED Provider Notes (Signed)
 [place] EMERGENCY DEPARTMENT AT [facility] Provider Note   CSN: [id number] Arrival date & time: 09/28/23  1344     Patient presents with: Code Stroke   Kimberly Kline is a 38 y.o. female.  She is here with a complaint of numbness and heaviness on the right side of her body that started around 1 PM.  It is mostly in her right face right hand right foot.  Feels harder to walk.  She said this similar thing happened about a year ago and she was told she had a stroke.  Followed up with a neurologist.   The history is provided by the patient.  Cerebrovascular Accident This is a recurrent problem. The current episode started less than 1 hour ago. The problem occurs constantly. The problem has not changed since onset.Pertinent negatives include no chest pain, no abdominal pain, no headaches and no shortness of breath. Nothing aggravates the symptoms. Nothing relieves the symptoms. She has tried nothing for the symptoms. The treatment provided no relief.       Prior to Admission medications   Medication Sig Start Date End Date Taking? Authorizing Provider  azelastine  (ASTELIN ) 0.1 % nasal spray Place 2 sprays into both nostrils 2 (two) times daily. Use in each nostril as directed 12/15/21   Zarwolo, Gloria, FNP  Chlorphen-PE-Acetaminophen  (NOREL AD) 4-10-325 MG TABS Take 1 tablet every 4 hours while symptoms persists. Do not take more than 6 tablets in 24 hours. 12/15/21   Zarwolo, Gloria, FNP  dextromethorphan  15 MG/5ML syrup Take 10 mLs (30 mg total) by mouth 4 (four) times daily as needed for cough. 12/15/21   Zarwolo, Gloria, FNP    Allergies: Patient has no known allergies.    Review of Systems  Constitutional:  Negative for fever.  HENT:  Negative for sore throat.   Respiratory:  Negative for shortness of breath.   Cardiovascular:  Negative for chest pain.  Gastrointestinal:  Negative for abdominal pain.  Genitourinary:  Negative for dysuria.  Neurological:  Positive  for numbness. Negative for speech difficulty and headaches.    Updated Vital Signs BP 117/88   Pulse 70   Temp 98.6 F (37 C) (Oral)   Resp 16   Ht 5' 3 (1.6 m)   Wt 93 kg   SpO2 96%   BMI 36.31 kg/m   Physical Exam Vitals and nursing note reviewed.  Constitutional:      General: She is not in acute distress.    Appearance: Normal appearance. She is well-developed.  HENT:     Head: Normocephalic and atraumatic.  Eyes:     Conjunctiva/sclera: Conjunctivae normal.  Cardiovascular:     Rate and Rhythm: Normal rate and regular rhythm.     Heart sounds: No murmur heard. Pulmonary:     Effort: Pulmonary effort is normal. No respiratory distress.     Breath sounds: Normal breath sounds. No stridor. No wheezing.  Abdominal:     Palpations: Abdomen is soft.     Tenderness: There is no abdominal tenderness. There is no guarding or rebound.  Musculoskeletal:        General: No tenderness or deformity. Normal range of motion.     Cervical back: Neck supple.  Skin:    General: Skin is warm and dry.  Neurological:     Mental Status: She is alert and oriented to person, place, and time.     GCS: GCS eye subscore is 4. GCS verbal subscore is 5. GCS motor  subscore is 6.     Sensory: Sensory deficit present.     Comments: Subjective decrease sensation right face right hand right lower extremity     (all labs ordered are listed, but only abnormal results are displayed) Labs Reviewed  CBC - Abnormal; Notable for the following components:      Result Value   RBC 3.81 (*)    All other components within normal limits  COMPREHENSIVE METABOLIC PANEL WITH GFR - Abnormal; Notable for the following components:   Alkaline Phosphatase 37 (*)    All other components within normal limits  I-STAT CHEM 8, ED - Abnormal; Notable for the following components:   BUN 21 (*)    All other components within normal limits  ETHANOL  PROTIME-INR  APTT  DIFFERENTIAL  RAPID URINE DRUG SCREEN, HOSP  PERFORMED  CBG MONITORING, ED  POC URINE PREG, ED    EKG: EKG Interpretation Date/Time:  Thursday September 28 2023 14:15:59 EDT Ventricular Rate:  64 PR Interval:  206 QRS Duration:  97 QT Interval:  406 QTC Calculation: 419 R Axis:   88  Text Interpretation: Sinus rhythm Borderline prolonged PR interval No significant change since prior 7/17 Confirmed by Towana Sharper [phone]) on 09/28/2023 2:29:37 PM  Radiology: MR BRAIN WO CONTRAST Result Date: 09/28/2023 CLINICAL DATA:  Provided history: Stroke suspected. EXAM: MRI HEAD WITHOUT CONTRAST TECHNIQUE: Multiplanar, multiecho pulse sequences of the brain and surrounding structures were obtained without intravenous contrast. COMPARISON:  Non-contrast head CT performed earlier today 09/28/2023. Brain MRI 08/23/2022. FINDINGS: Brain: Cerebral volume is normal. No cortical encephalomalacia is identified. No significant cerebral white matter disease. There is no acute infarct. No evidence of an intracranial mass. No chronic intracranial blood products. No extra-axial fluid collection. No midline shift. Vascular: Maintained flow voids within the proximal large arterial vessels. Skull and upper cervical spine: No focal worrisome marrow lesion. Sinuses/Orbits: No mass or acute finding within the imaged orbits. No significant paranasal sinus disease. IMPRESSION: Unremarkable non-contrast MRI appearance of the brain. No evidence of an acute intracranial abnormality. Electronically Signed   By: Rockey Childs D.O.   On: 09/28/2023 16:42   CT HEAD CODE STROKE WO CONTRAST Result Date: 09/28/2023 EXAM: CT HEAD WITHOUT CONTRAST 09/28/2023 02:13:43 PM TECHNIQUE: CT of the head was performed without the administration of intravenous contrast. Automated exposure control, iterative reconstruction, and/or weight based adjustment of the mA/kV was utilized to reduce the radiation dose to as low as reasonably achievable. COMPARISON: MRI head 08/23/2022 and CTA head and  neck 10/04/2022. CLINICAL HISTORY: Neuro deficit, acute, stroke suspected. Right side face numbness. FINDINGS: BRAIN AND VENTRICLES: No acute hemorrhage. No evidence of acute infarct. No hydrocephalus. No extra-axial collection. No mass effect or midline shift. ORBITS: No acute abnormality. SINUSES: No acute abnormality. SOFT TISSUES AND SKULL: No acute soft tissue abnormality. No skull fracture. [place] stroke program early CT (aspect) score ----- Ganglionic (caudate, ic, Lentiform Nucleus, insula, M1-m3): 7 Supraganglionic (m4-m6): 3 Total: 10 IMPRESSION: 1. No acute intracranial abnormality. 2. Findings discussed with Dr. Towana at 2:23PM on 09/28/23. Electronically signed by: Donnice Mania MD 09/28/2023 02:24 PM EDT RP Workstation: [id number]     .Critical Care  Performed by: Towana Sharper BROCKS, MD Authorized by: Towana Sharper BROCKS, MD   Critical care provider statement:    Critical care time (minutes):  45   Critical care time was exclusive of:  Separately billable procedures and treating other patients   Critical care was necessary to treat or prevent imminent  or life-threatening deterioration of the following conditions:  CNS failure or compromise   Critical care was time spent personally by me on the following activities:  Development of treatment plan with patient or surrogate, discussions with consultants, evaluation of patient's response to treatment, examination of patient, obtaining history from patient or surrogate, ordering and performing treatments and interventions, ordering and review of laboratory studies, ordering and review of radiographic studies, pulse oximetry, re-evaluation of patient's condition and review of old charts   I assumed direction of critical care for this patient from another provider in my specialty: no      Medications Ordered in the ED - No data to display  Clinical Course as of 09/28/23 1708  Thu Sep 28, 2023  1428 Received a call from radiology that the head  CT was negative. [MB]  1443 Code stroke activation ongoing [MB]  1447 Neurology Dr. Rosemarie is not recommending TNK as the symptoms are atypical and too mild to treat.  He does recommend an MRI.  If MRI is positive we will need to contact neurology again for further recommendations.  If negative likely can be discharged and follow-up with her outpatient treatment team. [MB]    Clinical Course User Index [MB] Towana Ozell BROCKS, MD                                 Medical Decision Making Amount and/or Complexity of Data Reviewed Labs: ordered. Radiology: ordered.   This patient complains of acute right sided numbness and possible weakness; this involves an extensive number of treatment Options and is a complaint that carries with it a high risk of complications and morbidity. The differential includes stroke, TIA, paresthesias, anxiety  I ordered, reviewed and interpreted labs, which included CBC normal chemistries and LFTs normal glucose normal I ordered imaging studies which included CT head, MRI brain and I independently    visualized and interpreted imaging which showed no acute findings Previous records obtained and reviewed in epic, patient seen about a year ago with similar presentation and negative workup I consulted teleneurology Dr. Rosemarie and discussed lab and imaging findings and discussed disposition.  Cardiac monitoring reviewed, sinus rhythm Social determinants considered, no significant barriers Critical Interventions: Code stroke activation requiring bedside presence and ordering interpreting of labs and imaging, discussion with consultants  After the interventions stated above, I reevaluated the patient and found patient still to be feeling numbness and tingling Admission and further testing considered, her care is signed out to Dr. Suzette to follow-up on final reading of MRI.  If negative patient can likely be discharged and follow-up outpatient with neurology.       Final diagnoses:  Numbness    ED Discharge Orders     None          Towana Ozell BROCKS, MD 09/28/23 1710

## 2023-09-28 NOTE — ED Notes (Signed)
 Teleneurology cart in room; waiting for new provider to sign on, as was told to charge nurse.

## 2023-09-28 NOTE — ED Notes (Signed)
 Teleneurology emergency stroke nurse signed off; stated Dr. Matthews could get in contact without her.

## 2023-09-28 NOTE — ED Notes (Addendum)
 Dr. Rosemarie on teleneurology cart signed on.
